# Patient Record
Sex: Female | Born: 1955 | Race: White | Hispanic: No | Marital: Married | Smoking: Never smoker
Health system: Southern US, Community
[De-identification: ages and names within clinical notes are randomized; demographics above are authoritative.]

## PROBLEM LIST (undated history)

## (undated) DIAGNOSIS — F419 Anxiety disorder, unspecified: Secondary | ICD-10-CM

## (undated) DIAGNOSIS — C801 Malignant (primary) neoplasm, unspecified: Secondary | ICD-10-CM

## (undated) DIAGNOSIS — T8859XA Other complications of anesthesia, initial encounter: Secondary | ICD-10-CM

## (undated) DIAGNOSIS — T502X5A Adverse effect of carbonic-anhydrase inhibitors, benzothiadiazides and other diuretics, initial encounter: Secondary | ICD-10-CM

## (undated) DIAGNOSIS — T4145XA Adverse effect of unspecified anesthetic, initial encounter: Secondary | ICD-10-CM

## (undated) DIAGNOSIS — R7303 Prediabetes: Secondary | ICD-10-CM

## (undated) DIAGNOSIS — I10 Essential (primary) hypertension: Secondary | ICD-10-CM

## (undated) DIAGNOSIS — I499 Cardiac arrhythmia, unspecified: Secondary | ICD-10-CM

## (undated) DIAGNOSIS — E876 Hypokalemia: Secondary | ICD-10-CM

## (undated) HISTORY — PX: COARCTATION OF AORTA REPAIR: SHX261

## (undated) HISTORY — PX: TONSILLECTOMY: SUR1361

## (undated) HISTORY — PX: ADENOIDECTOMY W/ MYRINGOTOMY: SHX1128

## (undated) HISTORY — PX: ENDOMETRIAL ABLATION: SHX621

---
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
## 2000-08-14 ENCOUNTER — Encounter (INDEPENDENT_AMBULATORY_CARE_PROVIDER_SITE_OTHER): Payer: Self-pay | Admitting: Specialist

## 2000-08-14 ENCOUNTER — Ambulatory Visit (HOSPITAL_COMMUNITY): Admission: RE | Admit: 2000-08-14 | Discharge: 2000-08-14 | Payer: Self-pay | Admitting: Urology

## 2004-07-24 ENCOUNTER — Other Ambulatory Visit: Admission: RE | Admit: 2004-07-24 | Discharge: 2004-07-24 | Payer: Self-pay | Admitting: Interventional Radiology

## 2004-07-24 ENCOUNTER — Encounter: Admission: RE | Admit: 2004-07-24 | Discharge: 2004-07-24 | Payer: Self-pay | Admitting: Surgery

## 2004-07-24 ENCOUNTER — Encounter (INDEPENDENT_AMBULATORY_CARE_PROVIDER_SITE_OTHER): Payer: Self-pay | Admitting: Specialist

## 2008-05-09 ENCOUNTER — Ambulatory Visit (HOSPITAL_COMMUNITY): Admission: RE | Admit: 2008-05-09 | Discharge: 2008-05-09 | Payer: Self-pay | Admitting: Obstetrics & Gynecology

## 2008-05-09 ENCOUNTER — Encounter (INDEPENDENT_AMBULATORY_CARE_PROVIDER_SITE_OTHER): Payer: Self-pay | Admitting: Obstetrics & Gynecology

## 2010-06-09 ENCOUNTER — Encounter: Payer: Self-pay | Admitting: Obstetrics & Gynecology

## 2010-10-02 NOTE — Op Note (Signed)
Laurie Mcneil, Laurie Mcneil             ACCOUNT NO.:  [id number]   MEDICAL RECORD NO.:  [id number]          PATIENT TYPE:  AMB   LOCATION:  SDC                           FACILITY:  [facility]   PHYSICIAN:  Genia Del, M.D.DATE OF BIRTH:  1956-03-25   DATE OF PROCEDURE:  DATE OF DISCHARGE:                               OPERATIVE REPORT   PREOPERATIVE DIAGNOSES:  Menorrhagia, normal endometrial cavity by  sonohysterogram, and benign endometrial biopsy.   POSTOPERATIVE DIAGNOSES:  Menorrhagia, normal endometrial cavity by  sonohysterogram, and benign endometrial biopsy, plus mild fundal  intrauterine synechiae.   PROCEDURES:  Hysteroscopy, dilatation and curettage with NovaSure  endometrial ablation.   SURGEON:  Genia Del, MD   ANESTHESIOLOGIST:  Belva Agee, MD   PROCEDURE:  Under general anesthesia with laryngeal mask, the patient  was in lithotomy position.  She was prepped with Betadine on the  suprapubic vulvar and vaginal areas and draped as usual.  The vaginal  exam reveals an anteverted uterus, normal volume, no adnexal mass.  The  speculum was introduced into the vagina.  We put a tenaculum on the  anterior lip of the cervix.  A paracervical block was done with  Nesacaine 1% 10 mL total at 4 and 8 o'clock.  We then proceed with a  hysterometry, which was at 11 cm.  The cervical length was at 5 cm, for  an endometrial cavity at 6 cm.  We then completed the cervical  dilatation with Hegar dilators up to #27 without difficulty.  We then  insert the diagnostic hysteroscope.  The intrauterine cavity was  inspected.  Both ostia are seen and pictures are taken.  We note mild  synechiae at the fundus of an intrauterine cavity.  No other  intrauterine lesion.  The hysteroscope was therefore removed.  We  proceed with a systematic curettage of the intrauterine cavity.  The  endometrial curettings are sent to pathology.  This was done with the  sharp curette on all surfaces.   We then insert the NovaSure device.  We  measured the width of the intrauterine cavity which was at 4.7 cm.  We  do the integrity test which was passed and then proceed with the  endometrial ablation.  The power was 155, duration 1 minute 30 seconds.  The instrument was then removed.  The tenaculum was removed from the  anterior lip of the cervix.  Hemostasis was adequate.  We removed the  speculum as well.  The estimated blood loss was minimal.  No  complications occurred and the patient was brought to recovery room in  good stable status.      Genia Del, M.D.  Electronically Signed     ML/MEDQ  D:  05/09/2008  T:  05/09/2008  Job:  [id number]

## 2010-10-05 NOTE — Op Note (Signed)
[facility]  Patient:    Laurie Mcneil, Laurie Mcneil                    MRN: [id number] Proc. Date: 08/14/00 Adm. Date:  [id number] Attending:  Laqueta Jean                           Operative Report  PREOPERATIVE DIAGNOSIS:  Interstitial cystitis.  POSTOPERATIVE DIAGNOSIS:  Interstitial cystitis.  OPERATION: 1. Cystourethroscopy 2. Urethral dilation. 3. Hydrodistention of the bladder. 4. Cold cup bladder biopsy and cauterization of biopsy sites. 5. Insertion of Marcaine and Pyridium in the bladder, injection of    Marcaine and Kenalog into the periurethral space, B & O suppository    placement.  SURGEON:  Sigmund I. Patsi Sears, M.D.  ANESTHESIA:  General (LMA).  PROCEDURE PREPARATION:  After appropriate preanesthesia, the patient was brought to the operating room and placed on the operating table in the dorsal supine position where general LMA anesthesia was introduced.  BRIEF HISTORY:  This 55 year old married white female has a history of urinary frequency, urgency, suprapubic pressure, with past history of panic attacks and possible ADHD.  There is specifically no history of migraines or irritable bowel syndrome or asthma.  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  DESCRIPTION OF PROCEDURE:  The urethra is dilated to a size 26-French with the urethral sounds.  Following this, cystoscopy was accomplished which shows a normal-appearing bladder.  The bladder holds 750 cc and is hydrodistended to 900 cc.  Biopsy is taken of the bladder, and following this, the sites are cauterized.  Marcaine/Pyridium solution is inserted into the bladder and Marcaine/Kenalog is injected into the periurethral space in the subtrigonal space.  B & O suppository is placed.  A vaginal packing is placed and will be removed in the recovery room.  She was awakened and take to recovery in good condition. DD:  08/14/00 TD:  08/14/00 Job:  [id number]

## 2011-02-22 LAB — CBC
RBC: 4.57 MIL/uL (ref 3.87–5.11)
WBC: 6.6 10*3/uL (ref 4.0–10.5)

## 2011-02-22 LAB — BASIC METABOLIC PANEL
BUN: 7 mg/dL (ref 6–23)
CO2: 32 mEq/L (ref 19–32)
Calcium: 9.5 mg/dL (ref 8.4–10.5)
Chloride: 99 mEq/L (ref 96–112)
Creatinine, Ser: 0.7 mg/dL (ref 0.4–1.2)
GFR calc Af Amer: >60 mL/min (ref >60)
GFR calc non Af Amer: >60 mL/min (ref >60)
Glucose, Bld: 73 mg/dL (ref 70–99)
Potassium: 3.1 mEq/L — ABNORMAL LOW (ref 3.5–5.1)
Sodium: 138 mEq/L (ref 135–145)

## 2011-02-22 LAB — PREGNANCY, URINE: Preg Test, Ur: NEGATIVE

## 2011-07-12 ENCOUNTER — Other Ambulatory Visit: Payer: Self-pay | Admitting: Dermatology

## 2012-07-15 ENCOUNTER — Other Ambulatory Visit: Payer: Self-pay | Admitting: Dermatology

## 2013-07-22 ENCOUNTER — Other Ambulatory Visit: Payer: Self-pay | Admitting: Dermatology

## 2013-07-27 ENCOUNTER — Other Ambulatory Visit: Payer: Self-pay | Admitting: Obstetrics & Gynecology

## 2013-07-29 ENCOUNTER — Encounter (HOSPITAL_COMMUNITY): Payer: Self-pay

## 2013-07-29 ENCOUNTER — Encounter (HOSPITAL_COMMUNITY)
Admission: RE | Admit: 2013-07-29 | Discharge: 2013-07-29 | Disposition: A | Discharge status: Home / Self Care | Payer: BC Managed Care – PPO | Source: Ambulatory Visit | Attending: Obstetrics & Gynecology | Admitting: Obstetrics & Gynecology

## 2013-07-29 DIAGNOSIS — Z01812 Encounter for preprocedural laboratory examination: Secondary | ICD-10-CM | POA: Insufficient documentation

## 2013-07-29 HISTORY — DX: Anxiety disorder, unspecified: F41.9

## 2013-07-29 HISTORY — DX: Malignant (primary) neoplasm, unspecified: C80.1

## 2013-07-29 HISTORY — DX: Cardiac arrhythmia, unspecified: I49.9

## 2013-07-29 LAB — BASIC METABOLIC PANEL
BUN: 15 mg/dL (ref 6–23)
CALCIUM: 9.9 mg/dL (ref 8.4–10.5)
CO2: 30 meq/L (ref 19–32)
CREATININE: 0.69 mg/dL (ref 0.50–1.10)
Chloride: 102 mEq/L (ref 96–112)
Glucose, Bld: 107 mg/dL — ABNORMAL HIGH (ref 70–99)
Potassium: 3.2 mEq/L — ABNORMAL LOW (ref 3.7–5.3)
SODIUM: 144 meq/L (ref 137–147)

## 2013-07-29 LAB — CBC
HCT: 38 % (ref 36.0–46.0)
Hemoglobin: 14.1 g/dL (ref 12.0–15.0)
MCH: 30.9 pg (ref 26.0–34.0)
MCHC: 37.1 g/dL — AB (ref 30.0–36.0)
MCV: 83.3 fL (ref 78.0–100.0)
PLATELETS: 164 10*3/uL (ref 150–400)
RBC: 4.56 MIL/uL (ref 3.87–5.11)
RDW: 13.9 % (ref 11.5–15.5)
WBC: 6 10*3/uL (ref 4.0–10.5)

## 2013-07-29 NOTE — Patient Instructions (Addendum)
Your procedure is scheduled on: Thursday, August 05, 2013  Enter through the [employer] [facility] at: 6:00AM  Pick up the phone at the desk and dial [phone].  Call this number if you have problems the morning of surgery: [phone].  Remember: Do NOT eat food: AFTER MIDNIGHT WEDNESDAY Do NOT drink clear liquids after: AFTER MIDNIGHT WEDNESDAY  Take these medicines the morning of surgery with a SIP OF WATER: Nadolol  Do NOT wear jewelry (body piercing), make-up, or nail polish. Do NOT wear lotions, powders, or perfumes.  You may wear deoderant. Do NOT shave for 48 hours prior to surgery. Do NOT bring valuables to the hospital. Contacts, dentures, or bridgework may not be worn into surgery. Leave suitcase in car.  After surgery it may be brought to your room.  For patients admitted to the hospital, checkout time is 11:00 AM the day of discharge.

## 2013-08-04 MED ORDER — CEFAZOLIN SODIUM-DEXTROSE 2-3 GM-% IV SOLR
2.0000 g | INTRAVENOUS | Status: AC
  Administered 2013-08-05 (×2): 2 g via INTRAVENOUS

## 2013-08-05 ENCOUNTER — Encounter (HOSPITAL_COMMUNITY): Payer: BC Managed Care – PPO | Admitting: Anesthesiology

## 2013-08-05 ENCOUNTER — Encounter (HOSPITAL_COMMUNITY): Payer: Self-pay | Admitting: Certified Registered"

## 2013-08-05 ENCOUNTER — Ambulatory Visit (HOSPITAL_COMMUNITY)
Admission: RE | Admit: 2013-08-05 | Discharge: 2013-08-06 | Disposition: A | Discharge status: Home / Self Care | Payer: BC Managed Care – PPO | Source: Ambulatory Visit | Attending: Obstetrics & Gynecology | Admitting: Obstetrics & Gynecology

## 2013-08-05 ENCOUNTER — Encounter (HOSPITAL_COMMUNITY): Payer: Self-pay | Admitting: Pharmacy Technician

## 2013-08-05 ENCOUNTER — Encounter (HOSPITAL_COMMUNITY)
Admission: RE | Disposition: A | Discharge status: Home / Self Care | Payer: Self-pay | Source: Ambulatory Visit | Attending: Obstetrics & Gynecology

## 2013-08-05 ENCOUNTER — Ambulatory Visit (HOSPITAL_COMMUNITY): Payer: BC Managed Care – PPO | Admitting: Anesthesiology

## 2013-08-05 DIAGNOSIS — D251 Intramural leiomyoma of uterus: Secondary | ICD-10-CM | POA: Insufficient documentation

## 2013-08-05 DIAGNOSIS — J988 Other specified respiratory disorders: Secondary | ICD-10-CM | POA: Insufficient documentation

## 2013-08-05 DIAGNOSIS — N8000 Endometriosis of the uterus, unspecified: Secondary | ICD-10-CM | POA: Insufficient documentation

## 2013-08-05 DIAGNOSIS — I499 Cardiac arrhythmia, unspecified: Secondary | ICD-10-CM | POA: Insufficient documentation

## 2013-08-05 DIAGNOSIS — Z9889 Other specified postprocedural states: Secondary | ICD-10-CM

## 2013-08-05 DIAGNOSIS — N840 Polyp of corpus uteri: Secondary | ICD-10-CM | POA: Insufficient documentation

## 2013-08-05 DIAGNOSIS — N920 Excessive and frequent menstruation with regular cycle: Secondary | ICD-10-CM | POA: Insufficient documentation

## 2013-08-05 DIAGNOSIS — D252 Subserosal leiomyoma of uterus: Secondary | ICD-10-CM | POA: Insufficient documentation

## 2013-08-05 DIAGNOSIS — I10 Essential (primary) hypertension: Secondary | ICD-10-CM | POA: Insufficient documentation

## 2013-08-05 DIAGNOSIS — F411 Generalized anxiety disorder: Secondary | ICD-10-CM | POA: Insufficient documentation

## 2013-08-05 HISTORY — PX: ROBOTIC ASSISTED TOTAL HYSTERECTOMY: SHX6085

## 2013-08-05 HISTORY — PX: BILATERAL SALPINGECTOMY: SHX5743

## 2013-08-05 LAB — CBC
HCT: 41.3 % (ref 36.0–46.0)
HEMOGLOBIN: 15.2 g/dL — AB (ref 12.0–15.0)
MCH: 31.2 pg (ref 26.0–34.0)
MCHC: 36.8 g/dL — ABNORMAL HIGH (ref 30.0–36.0)
MCV: 84.8 fL (ref 78.0–100.0)
Platelets: 152 10*3/uL (ref 150–400)
RBC: 4.87 MIL/uL (ref 3.87–5.11)
RDW: 13.5 % (ref 11.5–15.5)
WBC: 14.1 10*3/uL — ABNORMAL HIGH (ref 4.0–10.5)

## 2013-08-05 LAB — TYPE AND SCREEN
ABO/RH(D): O NEG
ANTIBODY SCREEN: NEGATIVE

## 2013-08-05 LAB — TROPONIN I: Troponin I: <0.3 ng/mL (ref <0.30)

## 2013-08-05 LAB — MRSA PCR SCREENING: MRSA by PCR: NEGATIVE

## 2013-08-05 LAB — ABO/RH: ABO/RH(D): O NEG

## 2013-08-05 LAB — PREGNANCY, URINE: Preg Test, Ur: NEGATIVE

## 2013-08-05 SURGERY — ROBOTIC ASSISTED TOTAL HYSTERECTOMY
Anesthesia: General | Site: Abdomen

## 2013-08-05 MED ORDER — DEXAMETHASONE SODIUM PHOSPHATE 10 MG/ML IJ SOLN
INTRAMUSCULAR | Status: DC | PRN
  Administered 2013-08-05: 10 mg via INTRAVENOUS

## 2013-08-05 MED ORDER — HYDROMORPHONE HCL PF 1 MG/ML IJ SOLN
INTRAMUSCULAR | Status: DC | PRN
  Administered 2013-08-05: 1 mg via INTRAVENOUS

## 2013-08-05 MED ORDER — PROMETHAZINE HCL 25 MG/ML IJ SOLN: 6.2500 mg | INTRAMUSCULAR | Status: DC | PRN

## 2013-08-05 MED ORDER — MEPERIDINE HCL 25 MG/ML IJ SOLN: 6.2500 mg | INTRAMUSCULAR | Status: DC | PRN

## 2013-08-05 MED ORDER — BUPIVACAINE HCL (PF) 0.25 % IJ SOLN
INTRAMUSCULAR | Status: AC
  Filled 2013-08-05: qty 20

## 2013-08-05 MED ORDER — EPHEDRINE 5 MG/ML INJ
INTRAVENOUS | Status: AC
  Filled 2013-08-05: qty 20

## 2013-08-05 MED ORDER — LACTATED RINGERS IR SOLN
Status: DC | PRN
  Administered 2013-08-05: 3000 mL

## 2013-08-05 MED ORDER — MIDAZOLAM HCL 2 MG/2ML IJ SOLN: 0.5000 mg | Freq: Once | INTRAMUSCULAR | Status: DC | PRN

## 2013-08-05 MED ORDER — GLYCOPYRROLATE 0.2 MG/ML IJ SOLN
INTRAMUSCULAR | Status: AC
  Filled 2013-08-05: qty 2

## 2013-08-05 MED ORDER — CEFAZOLIN SODIUM-DEXTROSE 2-3 GM-% IV SOLR
INTRAVENOUS | Status: AC
  Filled 2013-08-05: qty 50

## 2013-08-05 MED ORDER — ROCURONIUM BROMIDE 100 MG/10ML IV SOLN
INTRAVENOUS | Status: AC
  Filled 2013-08-05: qty 1

## 2013-08-05 MED ORDER — KETOROLAC TROMETHAMINE 30 MG/ML IJ SOLN
INTRAMUSCULAR | Status: AC
  Filled 2013-08-05: qty 1

## 2013-08-05 MED ORDER — EPHEDRINE SULFATE 50 MG/ML IJ SOLN
INTRAMUSCULAR | Status: DC | PRN
  Administered 2013-08-05 (×5): 10 mg via INTRAVENOUS
  Administered 2013-08-05: 5 mg via INTRAVENOUS

## 2013-08-05 MED ORDER — LIDOCAINE HCL (CARDIAC) 20 MG/ML IV SOLN
INTRAVENOUS | Status: AC
  Filled 2013-08-05: qty 5

## 2013-08-05 MED ORDER — NEOSTIGMINE METHYLSULFATE 1 MG/ML IJ SOLN
INTRAMUSCULAR | Status: DC | PRN
  Administered 2013-08-05: 4 mg via INTRAVENOUS

## 2013-08-05 MED ORDER — KETOROLAC TROMETHAMINE 30 MG/ML IJ SOLN
INTRAMUSCULAR | Status: DC | PRN
  Administered 2013-08-05: 30 mg via INTRAVENOUS

## 2013-08-05 MED ORDER — ONDANSETRON HCL 4 MG/2ML IJ SOLN
INTRAMUSCULAR | Status: AC
  Filled 2013-08-05: qty 2

## 2013-08-05 MED ORDER — LIDOCAINE HCL (CARDIAC) 20 MG/ML IV SOLN
INTRAVENOUS | Status: DC | PRN
  Administered 2013-08-05: 80 mg via INTRAVENOUS

## 2013-08-05 MED ORDER — LACTATED RINGERS IV SOLN
INTRAVENOUS | Status: DC
  Administered 2013-08-05: 08:00:00 via INTRAVENOUS
  Administered 2013-08-05: 100 mL via INTRAVENOUS
  Administered 2013-08-05: 12:00:00 via INTRAVENOUS

## 2013-08-05 MED ORDER — FENTANYL CITRATE 0.05 MG/ML IJ SOLN
INTRAMUSCULAR | Status: DC | PRN
  Administered 2013-08-05 (×3): 50 ug via INTRAVENOUS
  Administered 2013-08-05: 100 ug via INTRAVENOUS

## 2013-08-05 MED ORDER — PHENYLEPHRINE 40 MCG/ML (10ML) SYRINGE FOR IV PUSH (FOR BLOOD PRESSURE SUPPORT)
PREFILLED_SYRINGE | INTRAVENOUS | Status: AC
  Filled 2013-08-05: qty 5

## 2013-08-05 MED ORDER — LACTATED RINGERS IV SOLN
INTRAVENOUS | Status: DC
  Administered 2013-08-05 – 2013-08-06 (×3): via INTRAVENOUS

## 2013-08-05 MED ORDER — HYDROMORPHONE HCL PF 1 MG/ML IJ SOLN: 0.2500 mg | INTRAMUSCULAR | Status: DC | PRN

## 2013-08-05 MED ORDER — BUPIVACAINE HCL (PF) 0.25 % IJ SOLN
INTRAMUSCULAR | Status: DC | PRN
  Administered 2013-08-05: 14 mL

## 2013-08-05 MED ORDER — OXYCODONE-ACETAMINOPHEN 5-325 MG PO TABS
1.0000 | ORAL_TABLET | ORAL | Status: DC | PRN
  Administered 2013-08-06: 1 via ORAL
  Filled 2013-08-05: qty 1

## 2013-08-05 MED ORDER — PAROXETINE HCL 10 MG PO TABS
5.0000 mg | ORAL_TABLET | Freq: Every day | ORAL | Status: DC
  Administered 2013-08-06: 5 mg via ORAL
  Filled 2013-08-05 (×4): qty 0.5

## 2013-08-05 MED ORDER — PROPOFOL 10 MG/ML IV BOLUS
INTRAVENOUS | Status: DC | PRN
  Administered 2013-08-05: 200 mg via INTRAVENOUS

## 2013-08-05 MED ORDER — ONDANSETRON HCL 4 MG/2ML IJ SOLN
INTRAMUSCULAR | Status: DC | PRN
  Administered 2013-08-05: 4 mg via INTRAVENOUS

## 2013-08-05 MED ORDER — IBUPROFEN 600 MG PO TABS
600.0000 mg | ORAL_TABLET | Freq: Four times a day (QID) | ORAL | Status: DC | PRN
  Administered 2013-08-05 – 2013-08-06 (×2): 600 mg via ORAL
  Filled 2013-08-05 (×2): qty 1

## 2013-08-05 MED ORDER — HYDROMORPHONE HCL PF 1 MG/ML IJ SOLN
INTRAMUSCULAR | Status: AC
  Filled 2013-08-05: qty 1

## 2013-08-05 MED ORDER — MIDAZOLAM HCL 2 MG/2ML IJ SOLN
INTRAMUSCULAR | Status: AC
  Filled 2013-08-05: qty 2

## 2013-08-05 MED ORDER — FENTANYL CITRATE 0.05 MG/ML IJ SOLN
INTRAMUSCULAR | Status: AC
  Filled 2013-08-05: qty 2

## 2013-08-05 MED ORDER — FENTANYL CITRATE 0.05 MG/ML IJ SOLN
INTRAMUSCULAR | Status: AC
  Filled 2013-08-05: qty 5

## 2013-08-05 MED ORDER — MIDAZOLAM HCL 2 MG/2ML IJ SOLN
INTRAMUSCULAR | Status: DC | PRN
  Administered 2013-08-05: 2 mg via INTRAVENOUS

## 2013-08-05 MED ORDER — KETOROLAC TROMETHAMINE 30 MG/ML IJ SOLN: 15.0000 mg | Freq: Once | INTRAMUSCULAR | Status: DC | PRN

## 2013-08-05 MED ORDER — NEOSTIGMINE METHYLSULFATE 1 MG/ML IJ SOLN
INTRAMUSCULAR | Status: AC
  Filled 2013-08-05: qty 1

## 2013-08-05 MED ORDER — GLYCOPYRROLATE 0.2 MG/ML IJ SOLN
INTRAMUSCULAR | Status: AC
  Filled 2013-08-05: qty 3

## 2013-08-05 MED ORDER — PROPOFOL 10 MG/ML IV EMUL
INTRAVENOUS | Status: AC
  Filled 2013-08-05: qty 20

## 2013-08-05 MED ORDER — ROCURONIUM BROMIDE 100 MG/10ML IV SOLN
INTRAVENOUS | Status: DC | PRN
  Administered 2013-08-05: 20 mg via INTRAVENOUS
  Administered 2013-08-05: 10 mg via INTRAVENOUS
  Administered 2013-08-05: 50 mg via INTRAVENOUS
  Administered 2013-08-05: 10 mg via INTRAVENOUS

## 2013-08-05 MED ORDER — DEXAMETHASONE SODIUM PHOSPHATE 10 MG/ML IJ SOLN
INTRAMUSCULAR | Status: AC
  Filled 2013-08-05: qty 1

## 2013-08-05 MED ORDER — PHENYLEPHRINE HCL 10 MG/ML IJ SOLN
INTRAMUSCULAR | Status: DC | PRN
Start: 2013-08-05 — End: 2013-08-05
  Administered 2013-08-05 (×2): 40 ug via INTRAVENOUS

## 2013-08-05 MED ORDER — HYDROMORPHONE HCL PF 1 MG/ML IJ SOLN: 1.0000 mg | INTRAMUSCULAR | Status: DC | PRN

## 2013-08-05 MED ORDER — GLYCOPYRROLATE 0.2 MG/ML IJ SOLN
INTRAMUSCULAR | Status: DC | PRN
  Administered 2013-08-05: 0.3 mg via INTRAVENOUS
  Administered 2013-08-05: .7 mg via INTRAVENOUS
  Administered 2013-08-05: 0.3 mg via INTRAVENOUS

## 2013-08-05 MED ORDER — NADOLOL-BENDROFLUMETHIAZIDE 40-5 MG PO TABS
0.5000 | ORAL_TABLET | Freq: Every day | ORAL | Status: DC
  Administered 2013-08-06: 0.5 via ORAL

## 2013-08-05 SURGICAL SUPPLY — 65 items
ADH SKN CLS APL DERMABOND .7 (GAUZE/BANDAGES/DRESSINGS) ×1
BAG URINE DRAINAGE (UROLOGICAL SUPPLIES) ×3 IMPLANT
BARRIER ADHS 3X4 INTERCEED (GAUZE/BANDAGES/DRESSINGS) ×3 IMPLANT
BRR ADH 4X3 ABS CNTRL BYND (GAUZE/BANDAGES/DRESSINGS) ×1
CATH FOLEY 3WAY  5CC 16FR (CATHETERS) ×2
CATH FOLEY 3WAY 5CC 16FR (CATHETERS) ×1 IMPLANT
CLOTH BEACON ORANGE TIMEOUT ST (SAFETY) ×3 IMPLANT
CONT PATH 16OZ SNAP LID 3702 (MISCELLANEOUS) ×3 IMPLANT
COVER MAYO STAND STRL (DRAPES) ×3 IMPLANT
COVER TABLE BACK 60X90 (DRAPES) ×6 IMPLANT
COVER TIP SHEARS 8 DVNC (MISCELLANEOUS) ×1 IMPLANT
COVER TIP SHEARS 8MM DA VINCI (MISCELLANEOUS) ×2
DECANTER SPIKE VIAL GLASS SM (MISCELLANEOUS) ×3 IMPLANT
DERMABOND ADVANCED (GAUZE/BANDAGES/DRESSINGS) ×2
DERMABOND ADVANCED .7 DNX12 (GAUZE/BANDAGES/DRESSINGS) ×2 IMPLANT
DRAPE HUG U DISPOSABLE (DRAPE) ×3 IMPLANT
DRAPE LG THREE QUARTER DISP (DRAPES) ×6 IMPLANT
DRAPE WARM FLUID 44X44 (DRAPE) ×3 IMPLANT
ELECT REM PT RETURN 9FT ADLT (ELECTROSURGICAL) ×3
ELECTRODE REM PT RTRN 9FT ADLT (ELECTROSURGICAL) ×1 IMPLANT
EVACUATOR SMOKE 8.L (FILTER) ×3 IMPLANT
GAUZE VASELINE 3X9 (GAUZE/BANDAGES/DRESSINGS) IMPLANT
GLOVE BIO SURGEON STRL SZ 6.5 (GLOVE) ×3 IMPLANT
GLOVE BIO SURGEON STRL SZ7 (GLOVE) ×5 IMPLANT
GLOVE BIO SURGEONS STRL SZ 6.5 (GLOVE) ×2
GLOVE BIOGEL PI IND STRL 7.0 (GLOVE) ×1 IMPLANT
GLOVE BIOGEL PI INDICATOR 7.0 (GLOVE) ×6
GOWN STRL REUS W/TWL LRG LVL3 (GOWN DISPOSABLE) ×18 IMPLANT
IV STOPCOCK 4 WAY 40  W/Y SET (IV SOLUTION)
IV STOPCOCK 4 WAY 40 W/Y SET (IV SOLUTION) IMPLANT
KIT ACCESSORY DA VINCI DISP (KITS) ×2
KIT ACCESSORY DVNC DISP (KITS) ×1 IMPLANT
LEGGING LITHOTOMY PAIR STRL (DRAPES) ×3 IMPLANT
NEEDLE HYPO 22GX1.5 SAFETY (NEEDLE) IMPLANT
OCCLUDER COLPOPNEUMO (BALLOONS) ×2 IMPLANT
PACK LAVH (CUSTOM PROCEDURE TRAY) ×3 IMPLANT
PAD PREP 24X48 CUFFED NSTRL (MISCELLANEOUS) ×6 IMPLANT
PLUG CATH AND CAP STER (CATHETERS) ×3 IMPLANT
PROTECTOR NERVE ULNAR (MISCELLANEOUS) ×6 IMPLANT
SET CYSTO W/LG BORE CLAMP LF (SET/KITS/TRAYS/PACK) IMPLANT
SET IRRIG TUBING LAPAROSCOPIC (IRRIGATION / IRRIGATOR) ×6 IMPLANT
SOLUTION ELECTROLUBE (MISCELLANEOUS) ×3 IMPLANT
SUT VIC AB 0 CT1 27 (SUTURE) ×9
SUT VIC AB 0 CT1 27XBRD ANBCTR (SUTURE) ×2 IMPLANT
SUT VIC AB 0 CT1 27XBRD ANTBC (SUTURE) IMPLANT
SUT VIC AB 4-0 PS2 27 (SUTURE) ×6 IMPLANT
SUT VICRYL 0 UR6 27IN ABS (SUTURE) ×6 IMPLANT
SUT VLOC 180 0 9IN  GS21 (SUTURE) ×4
SUT VLOC 180 0 9IN GS21 (SUTURE) IMPLANT
SYR 50ML LL SCALE MARK (SYRINGE) ×3 IMPLANT
SYSTEM CONVERTIBLE TROCAR (TROCAR) ×2 IMPLANT
TIP RUMI ORANGE 6.7MMX12CM (TIP) IMPLANT
TIP UTERINE 5.1X6CM LAV DISP (MISCELLANEOUS) IMPLANT
TIP UTERINE 6.7X10CM GRN DISP (MISCELLANEOUS) ×2 IMPLANT
TIP UTERINE 6.7X6CM WHT DISP (MISCELLANEOUS) IMPLANT
TIP UTERINE 6.7X8CM BLUE DISP (MISCELLANEOUS) IMPLANT
TOWEL OR 17X24 6PK STRL BLUE (TOWEL DISPOSABLE) ×9 IMPLANT
TROCAR 12M 150ML BLUNT (TROCAR) IMPLANT
TROCAR DISP BLADELESS 8 DVNC (TROCAR) ×1 IMPLANT
TROCAR DISP BLADELESS 8MM (TROCAR) ×2
TROCAR XCEL 12X100 BLDLESS (ENDOMECHANICALS) ×3 IMPLANT
TROCAR XCEL NON-BLD 5MMX100MML (ENDOMECHANICALS) ×3 IMPLANT
TUBING FILTER THERMOFLATOR (ELECTROSURGICAL) ×6 IMPLANT
WARMER LAPAROSCOPE (MISCELLANEOUS) ×3 IMPLANT
WATER STERILE IRR 1000ML POUR (IV SOLUTION) ×9 IMPLANT

## 2013-08-05 NOTE — Transfer of Care (Signed)
Immediate Anesthesia Transfer of Care Note  Patient: Laurie Mcneil  Procedure(s) Performed: Procedure(s): ROBOTIC ASSISTED TOTAL HYSTERECTOMY (N/A) BILATERAL SALPINGECTOMY (N/A)  Patient Location: PACU  Anesthesia Type:General  Level of Consciousness: awake, alert  and oriented  Airway & Oxygen Therapy: Patient Spontanous Breathing and Patient connected to face mask oxygen  Post-op Assessment: Report given to PACU RN, Post -op Vital signs reviewed and stable and Patient moving all extremities X 4  Post vital signs: Reviewed and stable  Complications: No apparent anesthesia complications

## 2013-08-05 NOTE — Anesthesia Postprocedure Evaluation (Signed)
Anesthesia Post Note  Patient: Laurie Mcneil  Procedure(s) Performed: Procedure(s) (LRB): ROBOTIC ASSISTED TOTAL HYSTERECTOMY (N/A) BILATERAL SALPINGECTOMY (N/A)  Anesthesia type: General  Patient location: PACU  Post pain: Pain level controlled  Post assessment: Post-op Vital signs reviewed  Last Vitals:  Filed Vitals:   08/05/13 1330  BP: 136/78  Pulse: 65  Temp:   Resp: 18    Post vital signs: Reviewed  Level of consciousness: sedated  Complications: Had a brief 1-3 min period post extubation where she became apneic. Because the ECG leads had been taken off and the pulse oximeter was not recording CPR was in progress and a LMA had been placed prior to my arrival. When ECG leads were placed by me the patient had a NSR that was confirmed by palpation of R radial pulse. The patient began efforts to breathe spontaneously and there was prompt resumption of an appropriate end tidal CO2 tracing as well as well as 100% oxygen saturation. The patient has a remote history of cardiac valvular surgery. She has no ECG to compare her current one to despite the fact she had surgery in this hospital at age 58 in 2009. She has been stable in the recovery room, but will go to the AICU out of an abundance of caution. She is currently being ruled out for MI despite the respiratory nature of her arrest. The ECG is inconclusive.

## 2013-08-05 NOTE — H&P (Signed)
Laurie Mcneil is an 58 y.o. female G1P1  RP:  Menorrhagia with uterine myomas for Robotic TLH, salpingectomies  Pertinent Gynecological History: Menses: flow is moderate Contraception: condoms Blood transfusions: none Sexually transmitted diseases: no past history Last mammogram: normal  Last pap: normal OB History:  G1P1   Menstrual History:  No LMP recorded.    Past Medical History  Diagnosis Date  . Dysrhythmia   . Anxiety   . Cancer     SKIN    Past Surgical History  Procedure Laterality Date  . Coarctation of aorta repair  04/1970    AGE 13   . Cesarean section  1994  . Endometrial ablation    . Tonsillectomy    . Adenoidectomy w/ myringotomy      No family history on file.  Social History:  reports that she has never smoked. She has never used smokeless tobacco. She reports that she drinks alcohol. She reports that she does not use illicit drugs.  Allergies: No Known Allergies  Prescriptions prior to admission  Medication Sig Dispense Refill  . nadolol-bendroflumethiazide (CORZIDE) 40-5 MG per tablet Take 0.5 tablets by mouth daily.       Marland Kitchen PARoxetine (PAXIL) 10 MG tablet Take 5 mg by mouth daily.         ROS  Blood pressure 134/86, pulse 59, temperature 97.7 F (36.5 C), temperature source Oral, resp. rate 16, SpO2 99.00%. Physical Exam  Pelvic US:  Uterus with myomas, volume 565 g  04/2013  EBx benign endometrium with polyps  05/2013  Results for orders placed during the hospital encounter of 08/05/13 (from the past 24 hour(s))  TYPE AND SCREEN     Status: None   Collection Time    08/05/13  6:15 AM      Result Value Ref Range   ABO/RH(D) O NEG     Antibody Screen NEG     Sample Expiration 08/08/2013    PREGNANCY, URINE     Status: None   Collection Time    08/05/13  6:20 AM      Result Value Ref Range   Preg Test, Ur NEGATIVE  NEGATIVE    No results found.  Assessment/Plan: Menorrhagia with uterine myomas for Robotic  TLH/salpingectomies.  Surgery and risks reviewed.  Jaden Abreu,MARIE-LYNE 08/05/2013, 7:17 AM

## 2013-08-05 NOTE — Anesthesia Postprocedure Evaluation (Signed)
  Anesthesia Post-op Note  Anesthesia Post Note  Patient: Laurie Mcneil  Procedure(s) Performed: Procedure(s) (LRB): ROBOTIC ASSISTED TOTAL HYSTERECTOMY (N/A) BILATERAL SALPINGECTOMY (N/A)  Anesthesia type: General  Patient location: [facility]  Post pain: Pain level controlled  Post assessment: Post-op Vital signs reviewed  Last Vitals:  Filed Vitals:   08/05/13 1603  BP: 134/74  Pulse: 69  Temp:   Resp: 16    Post vital signs: Reviewed  Level of consciousness: sedated  Complications: No apparent anesthesia complications

## 2013-08-05 NOTE — Anesthesia Preprocedure Evaluation (Addendum)
Anesthesia Evaluation  Patient identified by MRN, date of birth, ID band Patient awake    Reviewed: Allergy & Precautions, H&P , NPO status , Patient's Chart, lab work & pertinent test results  Airway Mallampati: II TM Distance: >3 FB Neck ROM: Full    Dental  (+) Dental Advisory Given   Pulmonary neg pulmonary ROS,  breath sounds clear to auscultation        Cardiovascular hypertension, Pt. on home beta blockers and Pt. on medications + dysrhythmias Rhythm:Regular Rate:Normal     Neuro/Psych PSYCHIATRIC DISORDERS Anxiety negative neurological ROS     GI/Hepatic negative GI ROS, Neg liver ROS,   Endo/Other  negative endocrine ROS  Renal/GU negative Renal ROS     Musculoskeletal negative musculoskeletal ROS (+)   Abdominal (+) + obese,   Peds  Hematology negative hematology ROS (+)   Anesthesia Other Findings   Reproductive/Obstetrics negative OB ROS                          Anesthesia Physical Anesthesia Plan  ASA: II  Anesthesia Plan: General   Post-op Pain Management:    Induction: Intravenous  Airway Management Planned: Oral ETT  Additional Equipment:   Intra-op Plan:   Post-operative Plan: Extubation in OR  Informed Consent: I have reviewed the patients History and Physical, chart, labs and discussed the procedure including the risks, benefits and alternatives for the proposed anesthesia with the patient or authorized representative who has indicated his/her understanding and acceptance.   Dental advisory given  Plan Discussed with: CRNA  Anesthesia Plan Comments:         Anesthesia Quick Evaluation

## 2013-08-05 NOTE — Addendum Note (Signed)
Addendum created 08/05/13 1810 by Billie Lade, CRNA   Modules edited: Notes Section   Notes Section:  File: [id number]

## 2013-08-05 NOTE — OR Nursing (Signed)
Patient coded at 76. CPR performed for 32minute. Code called.

## 2013-08-05 NOTE — Op Note (Signed)
08/05/2013  12:03 PM  PATIENT:  Laurie Mcneil  58 y.o. female  PRE-OPERATIVE DIAGNOSIS:  Menorrhagia with uterine myomas  POST-OPERATIVE DIAGNOSIS:  Menorrhagia with uterine myomas  PROCEDURE:  Procedure(s): ROBOTIC ASSISTED TOTAL HYSTERECTOMY BILATERAL SALPINGECTOMY  SURGEON:  Surgeon(s): Princess Bruins, MD Elveria Royals, MD  ASSISTANTS: Dr Elveria Royals MD  ANESTHESIA:   general  PROCEDURE:  Under general anesthesia with endotracheal intubation the patient is in lithotomy position. She is prepped with ChloraPrep on the abdomen and with Betadine on the suprapubic, vulvar and vaginal areas.she is draped as usual. A Foley is inserted in the bladder. The vaginal exam reveals a large irregular uterus,no adnexal mass are felt.  The weighted speculum is inserted in the vagina and the anterior lip of the cervix is grasped with a tenaculum. The hysterometry is at 11 cm. A #10 roomy with a medium coring are put in place.  The tenaculum and weighted speculum were removed. We go to the abdomen and infiltrate the supraumbilical area with Marcaine one quarter plain. We make a 1.5 cm supraumbilical incision. The aponeurosis is opened with Mayo scissors under direct vision. The peritoneum is opened bluntly with a finger. A pursestring stitch of Vicryl 0 is applied at the aponeurosis.  The Sheryle Hail is inserted at that level and a pneumoperitoneum is created with CO2. The camera is inserted in that port.  Inspection of the abdominopelvic cavities reveals no adhesion.  The uterus is enlarged with multiple fibroids especially a large one on the right anterior aspect of the uterus.  Both tubes and both ovaries are normal to inspection.  We used a semicircular configuration for port placement with 2 robotic ports on the right and one robotic port on the lower left, the assistant port a 5 mm port is put in place on th left upper side.  At each site,  infiltration of Marcaine one quarter plain was done and a  small incision with the scalpel.  Ports were put in place under direct vision. The robot was docked from the right side. Robotic instruments were inserted under direct vision with the Endo Shears scissor on the right, the PK on the left and the fenestrated clamp in the third arm.  We then go to the console.  The appendix was normal in appearance and a picture was taken. Pictures were taken of on the ovaries and the uterus as well.  We started on the right side with cauterization and section of the right round ligament. We then cauterized and section the right mesosalpinx. We then cauterized and sectioned the right utero-ovarian ligament.  We stopped on that side before getting to the right uterine artery. We proceeded exactly the same way on the left side and then continued anteriorly opening the visceral peritoneum and lowering the bladder passed the coring.  We now finished opening the anterior peritoneum on the right side and lower the bladder further on that side.  We then cauterized the left uterine artery. We then                    cauterized and sectioned the right uterine artery and sectioned it. We then sectioned the left uterine artery.  We opened the vaginal cuff as high as possible over the coring anteriorly, then posteriorly and on each side. We left a little bit of attachment with the vagina while we were sectioning the uterus with the tip of the Endo Shears scissors to reduce the transverse diameter  of it in order to pass it vaginally more easily.  The uterus was finally completely detached from the vagina.  It was passed vaginally with additional morcellation vaginally.  The specimen was sent to pathology.  The occluder was put in the vagina. We switched instruments to the cutting needle driver and the right hand the long tip and the left hand and the PK in the third arm.  We used a V. LOC 9 inches to close the vaginal cuff starting at the right angle and a running towards the left angle of the  vagina.  We used a second V. LOC 9 inches starting at the left angle and running towards the right overlapping the other suture.  An additional suture of Vicryl 0 was used in an X stitch towards the right angle.  Hemostasis was verified and was adequate at all levels.  Irrigation and suction of the abdominopelvic cavities was done.  The robotic instruments were removed. The robot was undocked. We went by laparoscopy. We irrigated and suctioned the abdominopelvic cavities again after removing the patient from deep Trendelenburg. Good hemostasis was confirmed once more.  All instruments were removed. The ports were removed under direct vision. The CO2 was evacuated.  The pursestring stitch was attached at the supraumbilical incision. As this is was completed with the electrocautery on all incisions.  All incisions were closed with a subcuticular stitch of Vicryl 4-0. Dermabond was applied on all incisions. Hemostasis was verified at the vagina and was good.  At that point I left the OR. I was called back in shortly after because the patient had a transient obstruction of the airways after extubation. This was rapidly overcome by inserting a laryngeal mask. The patient was well oxygenated and reconnected to the anesthesia monitoring. The EKG lead was showing a good sinus rhythm.  When I arrived back in the room the patient was completely stable.  She was brought to recovery room in good and stable status.  Because of the transient airway obstruction, a 12-lead EKG and on troponin cardiac enzymes will be done. We will also bring her to  AICU for postop recovery by measure of precaution.    ESTIMATED BLOOD LOSS:  100 cc   Intake/Output Summary (Last 24 hours) at 08/05/13 1203 Last data filed at 08/05/13 1143  Gross per 24 hour  Intake   2000 ml  Output    600 ml  Net   1400 ml     BLOOD ADMINISTERED:none   LOCAL MEDICATIONS USED:  MARCAINE     SPECIMEN:  Source of Specimen:  Uterus/myomas with cervix,  bilateral tubes  DISPOSITION OF SPECIMEN:  PATHOLOGY  COUNTS:  YES  PLAN OF CARE: Transfer to PACU  Princess Bruins MD  08/05/2013 at 12:10 pm

## 2013-08-05 NOTE — OR Nursing (Signed)
SEE ANESTHESIA NOTES

## 2013-08-05 NOTE — Preoperative (Signed)
Beta Blockers   Reason not to administer Beta Blockers:Bblocker taken 08/05/13 @0515 

## 2013-08-06 ENCOUNTER — Encounter (HOSPITAL_COMMUNITY): Payer: Self-pay | Admitting: Obstetrics & Gynecology

## 2013-08-06 LAB — CBC
HCT: 34.1 % — ABNORMAL LOW (ref 36.0–46.0)
HEMOGLOBIN: 12.5 g/dL (ref 12.0–15.0)
MCH: 31 pg (ref 26.0–34.0)
MCHC: 36.7 g/dL — AB (ref 30.0–36.0)
MCV: 84.6 fL (ref 78.0–100.0)
Platelets: 160 10*3/uL (ref 150–400)
RBC: 4.03 MIL/uL (ref 3.87–5.11)
RDW: 13.5 % (ref 11.5–15.5)
WBC: 12.4 10*3/uL — ABNORMAL HIGH (ref 4.0–10.5)

## 2013-08-06 MED ORDER — OXYCODONE-ACETAMINOPHEN 7.5-325 MG PO TABS
1.0000 | ORAL_TABLET | ORAL | Status: DC | PRN
Start: 2013-08-06 — End: 2016-11-26

## 2013-08-06 NOTE — Progress Notes (Signed)
Discharge instructions reviewed w/pt & spouse & prescription given for Percocet

## 2013-08-06 NOTE — Progress Notes (Signed)
Pt. C/o foley catheter being uncomfortable and feeling "urge to urinate" Have manipulated position of foley and flushed several times to get foley to drain large amount only to stop draining and pt. Feeling distended bladder again. Orders received to d/c foley

## 2013-08-06 NOTE — Discharge Instructions (Signed)
Total Laparoscopic Hysterectomy, Care After  Refer to this sheet in the next few weeks. These instructions provide you with information on caring for yourself after your procedure. Your health care provider may also give you more specific instructions. Your treatment has been planned according to current medical practices, but problems sometimes occur. Call your health care provider if you have any problems or questions after your procedure.  WHAT TO EXPECT AFTER THE PROCEDURE  · Pain and bruising at the incision sites. You will be given pain medicine to control it.  · Menopausal symptoms such as hot flashes, night sweats, and insomnia if your ovaries were removed.  · Sore throat from the breathing tube that was inserted during surgery.  HOME CARE INSTRUCTIONS  · Only take over-the-counter or prescription medicines for pain, discomfort, or fever as directed by your health care provider.    · Do not take aspirin. It can cause bleeding.    · Do not drive when taking pain medicine.    · Follow your health care provider's advice regarding diet, exercise, lifting, driving, and general activities.    · Resume your usual diet as directed and allowed.    · Get plenty of rest and sleep.    · Do not douche, use tampons, or have sexual intercourse for at least 6 weeks, or until your health care provider gives you permission.    · Change your bandages (dressings) as directed by your health care provider.    · Monitor your temperature and notify your health care provider of a fever.    · Take showers instead of baths for 2 3 weeks.    · Do not drink alcohol until your health care provider gives you permission.    · If you develop constipation, you may take a mild laxative with your health care provider's permission. Bran foods may help with constipation problems. Drinking enough fluids to keep your urine clear or pale yellow may help as well.    · Try to have someone home with you for 1 2 weeks to help around the house.     · Keep all of your follow-up appointments as directed by your health care provider.    SEEK MEDICAL CARE IF:  · You have swelling, redness, or increasing pain around your incision sites.    · You have pus coming from your incision.    · You notice a bad smell coming from your incision.    · Your incision breaks open.    · You feel dizzy or lightheaded.    · You have pain or bleeding when you urinate.    · You have persistent diarrhea.    · You have persistent nausea and vomiting.    · You have abnormal vaginal discharge.    · You have a rash.    · You have any type of abnormal reaction or develop an allergy to your medicine.    · You have poor pain control with your prescribed medicine.    SEEK IMMEDIATE MEDICAL CARE IF:  · You have chest pain or shortness of breath.  · You have severe abdominal pain that is not relieved with pain medicine.  · You have pain or swelling in your legs.  Document Released: 02/24/2013 Document Reviewed: 11/24/2012  [employer]® Patient Information ©2014 [employer].

## 2013-08-06 NOTE — Progress Notes (Addendum)
1 Day Post-Op Procedure(s) (LRB): ROBOTIC ASSISTED TOTAL HYSTERECTOMY (N/A) BILATERAL SALPINGECTOMY (N/A)  Airway obstruction immediately after extubation. Laryngeal mask inserted promptly with bag ventilation which restored oxygenation with cardiac compressions.  Leads put back in place showing sinus rhythm.  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 H/O Dysrythmia and mild cHTN on Corzide.  Subjective: Patient reports that pain is well managed.  Tolerating normal diet as tolerated  diet without difficulty. No nausea / vomiting.  Ambulating and voiding.  No chest pain, no palpitation.  No SOB.  Objective: BP 101/56  Pulse 59  Temp(Src) 98.3 F (36.8 C) (Oral)  Resp 16  Ht 5\' 2"  (1.575 m)  Wt 82.691 kg (182 lb 4.8 oz)  BMI 33.33 kg/m2  SpO2 100% Lungs: clear Heart: normal rate and rhythm Abdomen:soft and appropriately tender Extremities: Homans sign is negative, no sign of DVT Incision: healing well  Postop CBC:  Hb 12.5                         WBC 12.4 Troponin-1 neg EKG PACU abnormal.  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  Discussed with Cardio PA.  Assessment: s/p Procedure(s): ROBOTIC ASSISTED TOTAL HYSTERECTOMY BILATERAL SALPINGECTOMY: progressing well Post extubation transient airway obstruction Abnormal EKG, Troponin 1 neg.  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  No cardiac Sx.  Plan: Discharge home F/U with Fam MD with information on EKG result for further investigation with Cardiac Echo.  LOS: 1 day    Konstantine Gervasi,MARIE-LYNE 08/06/2013, 11:04 AM

## 2013-08-06 NOTE — Discharge Summary (Addendum)
  Physician Discharge Summary  Patient ID: Laurie Mcneil MRN: [id number] DOB/AGE: [birth year] 58 y.o.  Admit date: 08/05/2013 Discharge date: 08/06/2013  Admission Diagnoses: Menorrhagia, uterine myomas  Discharge Diagnoses: Menorrhagia, uterine myomas                                         Transient Airway obstruction post op        Active Problems:   Post-operative state   Discharged Condition: good  Hospital Course: good  Consults: cardiology for EKG interpretation  Treatments: surgery: Robotic total hysterectomy with bilateral salpingectomies  Disposition: D/C home     Medication List         nadolol-bendroflumethiazide 40-5 MG per tablet  Commonly known as:  CORZIDE  Take 0.5 tablets by mouth daily.     oxyCODONE-acetaminophen 7.5-325 MG per tablet  Commonly known as:  PERCOCET  Take 1 tablet by mouth every 4 (four) hours as needed for pain.     PARoxetine 10 MG tablet  Commonly known as:  PAXIL  Take 5 mg by mouth daily.           Follow-up Information   Follow up with Thien Berka,MARIE-LYNE, MD In 3 weeks.   Specialty:  Obstetrics and Gynecology   Contact information:   [place] [address] [phone]       Signed: Princess Bruins, MD 08/06/2013, 12:01 PM

## 2013-08-06 NOTE — Progress Notes (Signed)
Foley cath. Also sl. "kinked" pt. With much relief after it drained

## 2013-08-12 NOTE — Progress Notes (Signed)
Post discharge chart review completed.  

## 2014-03-21 ENCOUNTER — Encounter (HOSPITAL_COMMUNITY): Payer: Self-pay | Admitting: Obstetrics & Gynecology

## 2014-05-20 HISTORY — PX: COLONOSCOPY: SHX174

## 2016-09-13 ENCOUNTER — Other Ambulatory Visit: Payer: Self-pay | Admitting: Physician Assistant

## 2016-09-13 DIAGNOSIS — R1031 Right lower quadrant pain: Secondary | ICD-10-CM

## 2016-09-13 DIAGNOSIS — R1011 Right upper quadrant pain: Secondary | ICD-10-CM

## 2016-09-20 ENCOUNTER — Ambulatory Visit
Admission: RE | Admit: 2016-09-20 | Discharge: 2016-09-20 | Disposition: A | Discharge status: Home / Self Care | Payer: BC Managed Care – PPO | Source: Ambulatory Visit | Attending: Physician Assistant | Admitting: Physician Assistant

## 2016-09-20 DIAGNOSIS — R1031 Right lower quadrant pain: Secondary | ICD-10-CM

## 2016-09-20 DIAGNOSIS — R1011 Right upper quadrant pain: Secondary | ICD-10-CM

## 2016-09-20 MED ORDER — IOPAMIDOL (ISOVUE-300) INJECTION 61%: 100.0000 mL | Freq: Once | INTRAVENOUS | Status: DC | PRN

## 2016-09-20 MED ORDER — IOHEXOL 300 MG/ML  SOLN
30.0000 mL | Freq: Once | INTRAMUSCULAR | Status: AC | PRN
  Administered 2016-09-20: 30 mL via ORAL

## 2016-09-20 MED ORDER — IOHEXOL 300 MG/ML  SOLN: 30.0000 mL | Freq: Once | INTRAMUSCULAR | Status: DC | PRN

## 2016-10-21 ENCOUNTER — Other Ambulatory Visit: Payer: Self-pay | Admitting: General Surgery

## 2016-11-28 NOTE — Pre-Procedure Instructions (Signed)
Laurie Mcneil  11/28/2016      [id number] - [place], [place] - [address] [place] [address] Phone: [phone] Fax: [phone]    Your procedure is scheduled on December 13, 2016.  Report to [facility] Admitting at 800 AM.  Call this number if you have problems the morning of surgery:  [phone]   Remember:  Do not eat food or drink liquids after midnight.  Take these medicines the morning of surgery with A SIP OF WATER allopurinol (zyloprim), paroxetine (paxil).  7 days prior to surgery STOP taking any Aspirin, Aleve, Naproxen, Ibuprofen, Motrin, Advil, Goody's, BC's, all herbal medications, fish oil, and all vitamins   Do not wear jewelry, make-up or nail polish.  Do not wear lotions, powders, or perfumes, or deoderant.  Do not shave 48 hours prior to surgery.    Do not bring valuables to the hospital.  [facility] is not responsible for any belongings or valuables.  Contacts, dentures or bridgework may not be worn into surgery.  Leave your suitcase in the car.  After surgery it may be brought to your room.  For patients admitted to the hospital, discharge time will be determined by your treatment team.  Patients discharged the day of surgery will not be allowed to drive home.   Special instructions:   [facility]- Preparing For Surgery  Before surgery, you can play an important role. Because skin is not sterile, your skin needs to be as free of germs as possible. You can reduce the number of germs on your skin by washing with CHG (chlorahexidine gluconate) Soap before surgery.  CHG is an antiseptic cleaner which kills germs and bonds with the skin to continue killing germs even after washing.  Please do not use if you have an allergy to CHG or antibacterial soaps. If your skin becomes reddened/irritated stop using the CHG.  Do not shave (including legs and underarms) for at least  48 hours prior to first CHG shower. It is OK to shave your face.  Please follow these instructions carefully.   1. Shower the NIGHT BEFORE SURGERY and the MORNING OF SURGERY with CHG.   2. If you chose to wash your hair, wash your hair first as usual with your normal shampoo.  3. After you shampoo, rinse your hair and body thoroughly to remove the shampoo.  4. Use CHG as you would any other liquid soap. You can apply CHG directly to the skin and wash gently with a scrungie or a clean washcloth.   5. Apply the CHG Soap to your body ONLY FROM THE NECK DOWN.  Do not use on open wounds or open sores. Avoid contact with your eyes, ears, mouth and genitals (private parts). Wash genitals (private parts) with your normal soap.  6. Wash thoroughly, paying special attention to the area where your surgery will be performed.  7. Thoroughly rinse your body with warm water from the neck down.  8. DO NOT shower/wash with your normal soap after using and rinsing off the CHG Soap.  9. Pat yourself dry with a CLEAN TOWEL.   10. Wear CLEAN PAJAMAS   11. Place CLEAN SHEETS on your bed the night of your first shower and DO NOT SLEEP WITH PETS.    Day of Surgery: Do not apply any deodorants/lotions. Please wear clean clothes to the hospital/surgery center.  Please read over the following fact sheets that you were given. Pain Booklet, Coughing and Deep Breathing and Surgical Site Infection Prevention

## 2016-11-29 ENCOUNTER — Inpatient Hospital Stay (HOSPITAL_COMMUNITY)
Admission: RE | Admit: 2016-11-29 | Discharge: 2016-11-29 | Disposition: A | Discharge status: Home / Self Care | Payer: BC Managed Care – PPO | Source: Ambulatory Visit

## 2016-11-29 NOTE — Progress Notes (Signed)
Spoke with [employer], and she sts that her surgery has been rescheduled for 12/13/16 at the [facility], and that she was told by that campus not to come to this appointment.

## 2016-12-05 NOTE — Patient Instructions (Addendum)
Laurie Mcneil  12/05/2016   Your procedure is scheduled on: 12-13-16  Report to [facility] Main  Entrance Take [place]  elevators to 3rd floor to  [place] at 8:30AM.    Call this number if you have problems the morning of surgery [phone]    Remember: ONLY 1 PERSON MAY GO WITH YOU TO SHORT STAY TO GET  READY MORNING OF [place].  Do not eat food or drink liquids :After Midnight.     Take these medicines the morning of surgery with A SIP OF WATER: allopurinol(zyloprim), paroxetine(paxil)                                You may not have any metal on your body including hair pins and              piercings  Do not wear jewelry, make-up, lotions, powders or perfumes, deodorant             Do not wear nail polish.  Do not shave  48 hours prior to surgery.              Men may shave face and neck.   Do not bring valuables to the hospital. [place].  Contacts, dentures or bridgework may not be worn into surgery.       Patients discharged the day of surgery will not be allowed to drive home.  Name and phone number of your driver:  Special Instructions: N/A              Please read over the following fact sheets you were given: _____________________________________________________________________        [facility] - Preparing for Surgery Before surgery, you can play an important role.  Because skin is not sterile, your skin needs to be as free of germs as possible.  You can reduce the number of germs on your skin by washing with CHG (chlorahexidine gluconate) soap before surgery.  CHG is an antiseptic cleaner which kills germs and bonds with the skin to continue killing germs even after washing. Please DO NOT use if you have an allergy to CHG or antibacterial soaps.  If your skin becomes reddened/irritated stop using the CHG and inform your nurse when you arrive at Short Stay. Do not shave  (including legs and underarms) for at least 48 hours prior to the first CHG shower.  You may shave your face/neck. Please follow these instructions carefully:  1.  Shower with CHG Soap the night before surgery and the  morning of Surgery.  2.  If you choose to wash your hair, wash your hair first as usual with your  normal  shampoo.  3.  After you shampoo, rinse your hair and body thoroughly to remove the  shampoo.                           4.  Use CHG as you would any other liquid soap.  You can apply chg directly  to the skin and wash                       Gently with a scrungie  or clean washcloth.  5.  Apply the CHG Soap to your body ONLY FROM THE NECK DOWN.   Do not use on face/ open                           Wound or open sores. Avoid contact with eyes, ears mouth and genitals (private parts).                       Wash face,  Genitals (private parts) with your normal soap.             6.  Wash thoroughly, paying special attention to the area where your surgery  will be performed.  7.  Thoroughly rinse your body with warm water from the neck down.  8.  DO NOT shower/wash with your normal soap after using and rinsing off  the CHG Soap.                9.  Pat yourself dry with a clean towel.            10.  Wear clean pajamas.            11.  Place clean sheets on your bed the night of your first shower and do not  sleep with pets. Day of Surgery : Do not apply any lotions/deodorants the morning of surgery.  Please wear clean clothes to the hospital/surgery center.  FAILURE TO FOLLOW THESE INSTRUCTIONS MAY RESULT IN THE CANCELLATION OF YOUR SURGERY PATIENT SIGNATURE_________________________________  NURSE SIGNATURE__________________________________  ________________________________________________________________________

## 2016-12-06 ENCOUNTER — Encounter (HOSPITAL_COMMUNITY): Payer: Self-pay | Admitting: Emergency Medicine

## 2016-12-06 ENCOUNTER — Encounter (HOSPITAL_COMMUNITY)
Admission: RE | Admit: 2016-12-06 | Discharge: 2016-12-06 | Disposition: A | Discharge status: Home / Self Care | Payer: BC Managed Care – PPO | Source: Ambulatory Visit | Attending: General Surgery | Admitting: General Surgery

## 2016-12-06 DIAGNOSIS — R9431 Abnormal electrocardiogram [ECG] [EKG]: Secondary | ICD-10-CM | POA: Insufficient documentation

## 2016-12-06 DIAGNOSIS — K8012 Calculus of gallbladder with acute and chronic cholecystitis without obstruction: Secondary | ICD-10-CM | POA: Insufficient documentation

## 2016-12-06 DIAGNOSIS — E119 Type 2 diabetes mellitus without complications: Secondary | ICD-10-CM | POA: Insufficient documentation

## 2016-12-06 DIAGNOSIS — I10 Essential (primary) hypertension: Secondary | ICD-10-CM | POA: Insufficient documentation

## 2016-12-06 DIAGNOSIS — Z01818 Encounter for other preprocedural examination: Secondary | ICD-10-CM | POA: Diagnosis present

## 2016-12-06 HISTORY — DX: Adverse effect of carbonic-anhydrase inhibitors, benzothiadiazides and other diuretics, initial encounter: T50.2X5A

## 2016-12-06 HISTORY — DX: Hypokalemia: E87.6

## 2016-12-06 HISTORY — DX: Prediabetes: R73.03

## 2016-12-06 HISTORY — DX: Other complications of anesthesia, initial encounter: T88.59XA

## 2016-12-06 HISTORY — DX: Essential (primary) hypertension: I10

## 2016-12-06 HISTORY — DX: Adverse effect of unspecified anesthetic, initial encounter: T41.45XA

## 2016-12-06 LAB — CBC WITH DIFFERENTIAL/PLATELET
BASOS ABS: 0 10*3/uL (ref 0.0–0.1)
Basophils Relative: 0 %
EOS ABS: 0.2 10*3/uL (ref 0.0–0.7)
Eosinophils Relative: 2 %
HCT: 39 % (ref 36.0–46.0)
HEMOGLOBIN: 13.7 g/dL (ref 12.0–15.0)
LYMPHS ABS: 1.6 10*3/uL (ref 0.7–4.0)
LYMPHS PCT: 24 %
MCH: 29 pg (ref 26.0–34.0)
MCHC: 35.1 g/dL (ref 30.0–36.0)
MCV: 82.5 fL (ref 78.0–100.0)
Monocytes Absolute: 0.6 10*3/uL (ref 0.1–1.0)
Monocytes Relative: 9 %
NEUTROS PCT: 65 %
Neutro Abs: 4.4 10*3/uL (ref 1.7–7.7)
Platelets: 169 10*3/uL (ref 150–400)
RBC: 4.73 MIL/uL (ref 3.87–5.11)
RDW: 13.8 % (ref 11.5–15.5)
WBC: 6.8 10*3/uL (ref 4.0–10.5)

## 2016-12-06 LAB — COMPREHENSIVE METABOLIC PANEL
ALK PHOS: 64 U/L (ref 38–126)
ALT: 13 U/L — AB (ref 14–54)
AST: 23 U/L (ref 15–41)
Albumin: 4.3 g/dL (ref 3.5–5.0)
Anion gap: 9 (ref 5–15)
BUN: 13 mg/dL (ref 6–20)
CALCIUM: 9.6 mg/dL (ref 8.9–10.3)
CHLORIDE: 104 mmol/L (ref 101–111)
CO2: 29 mmol/L (ref 22–32)
CREATININE: 0.78 mg/dL (ref 0.44–1.00)
GFR calc non Af Amer: >60 mL/min (ref >60)
GLUCOSE: 95 mg/dL (ref 65–99)
Potassium: 3.5 mmol/L (ref 3.5–5.1)
SODIUM: 142 mmol/L (ref 135–145)
Total Bilirubin: 2.1 mg/dL — ABNORMAL HIGH (ref 0.3–1.2)
Total Protein: 7 g/dL (ref 6.5–8.1)

## 2016-12-06 LAB — URINALYSIS, ROUTINE W REFLEX MICROSCOPIC
BILIRUBIN URINE: NEGATIVE
Glucose, UA: NEGATIVE mg/dL
Hgb urine dipstick: NEGATIVE
Ketones, ur: NEGATIVE mg/dL
Leukocytes, UA: NEGATIVE
NITRITE: NEGATIVE
PROTEIN: NEGATIVE mg/dL
SPECIFIC GRAVITY, URINE: 1.015 (ref 1.005–1.030)
pH: 6 (ref 5.0–8.0)

## 2016-12-06 NOTE — Progress Notes (Signed)
Surgeon Dr Barry Dienes ordered anaesthesilogy consult , reason for pt history of respiratory arrest after ET tube extubation following hysterectomy 07-2013. RN called and spoke to Dr Jillyn Hidden and informed him of order and notified him of the event. [place]tt inquired of patient disposition today (ie hx sleep apnea, whether obese, and hx of respiratory conditions), patient had no remarkable respiratory issues with exception of prior surgical event. Dr Jillyn Hidden expressed that no other F/U would be necessary today  And that Anesthesia would handle patient as needed day of surgery because likely the respiratory event was and "one time thing" and is doubtful such an event will be repeated. RN informed patient of recommendation' patient verbalized some nervousness but  understanding and agreeable to situation .

## 2016-12-09 NOTE — Progress Notes (Signed)
Time change had already occurred when pt was at pre-op appt. Pt aware that surgery is scheduled to start at 1030am and to arrive at 830am.

## 2016-12-11 NOTE — H&P (Signed)
[place] Location: [facility] Patient #: [id number] DOB: 03-14-1956 Married / Language: English / Race: White Female   History of Present Illness The patient is a 61 year old female who presents for evaluation of gall stones. Patient is a 61 year old female who is referred by PA Anderson Malta Couillard consultation regarding a new diagnosis of gallstones. Around 6 weeks ago the patient went to [place] with a friend. She awoke one night after a very rich meal with the band of pain in her upper abdomen. This resolved after around 12 hours. Since that time, she has had some soreness and tightness in her right upper quadrant. If she leans forward she can feel area of discomfort. She has started awaiting sweets and milk products due to exacerbation of the discomfort. It is not painful that she has taken pain medication. She does have a significant family history of gallbladder disease. She has a maternal grandfather and a mother who required cholecystectomy. Of note, her bilirubin has been elevated for multiple years. It was 2.4 after her workup. She states that this has been true for quite a long time.   The patient had a right upper quadrant ultrasound that showed multiple gallstones. She had no evidence of dilated bile duct. These records are in [employer]. A CBC was normal and CMET was remarkable for a potassium of 3 and a bilirubin of 2.4. The remainder of her LFTs and her amylase were normal. A CT of the pelvis was performed which was negative. At the point that she was having her evaluation by the PA, the pain seemed to be a little bit lower and appendicitis was ruled out.   Past Surgical History Cesarean Section - 1  Colon Polyp Removal - Colonoscopy  Hysterectomy (not due to cancer) - Partial  Oral Surgery  Tonsillectomy  Valve Replacement   Diagnostic Studies History Colonoscopy  1-5 years ago Mammogram  within last year Pap Smear  1-5 years  ago  Allergies No Known Drug Allergies 10/21/2016 Allergies Reconciled   Medication History  Allopurinol (100MG  Tablet, Oral) Active. Klor-Con (8MEQ Tablet ER, Oral) Active. PARoxetine HCl (10MG  Tablet, Oral) Active. Nadolol-Bendroflumethiazide (40-5MG  Tablet, Oral) Active. Medications Reconciled  Social History Alcohol use  Occasional alcohol use. Caffeine use  Coffee. No drug use  Tobacco use  Never smoker.  Family History Arthritis  Father, Mother, Sister. Breast Cancer  Mother. Depression  Mother. Hypertension  Father. Melanoma  Father, Mother. Prostate Cancer  Father.  Pregnancy / Birth History Age at menarche  44 years. Age of menopause  6-60 Gravida  1 Length (months) of breastfeeding  3-6 Maternal age  87-35 Para  1 Regular periods   Other Problems  Anxiety Disorder  Arthritis  Cholelithiasis  General anesthesia - complications  Heart murmur  High blood pressure  Melanoma  Sleep Apnea     Review of Systems  General Not Present- Appetite Loss, Chills, Fatigue, Fever, Night Sweats, Weight Gain and Weight Loss. Skin Not Present- Change in Wart/Mole, Dryness, Hives, Jaundice, New Lesions, Non-Healing Wounds, Rash and Ulcer. HEENT Present- Seasonal Allergies and Wears glasses/contact lenses. Not Present- Earache, Hearing Loss, Hoarseness, Nose Bleed, Oral Ulcers, Ringing in the Ears, Sinus Pain, Sore Throat, Visual Disturbances and Yellow Eyes. Respiratory Present- Wheezing. Not Present- Bloody sputum, Chronic Cough, Difficulty Breathing and Snoring. Breast Not Present- Breast Mass, Breast Pain, Nipple Discharge and Skin Changes. Cardiovascular Present- Palpitations. Not Present- Chest Pain, Difficulty Breathing Lying Down, Leg Cramps, Rapid Heart Rate, Shortness of Breath  and Swelling of Extremities. Gastrointestinal Present- Indigestion. Not Present- Abdominal Pain, Bloating, Bloody Stool, Change in Bowel Habits, Chronic  diarrhea, Constipation, Difficulty Swallowing, Excessive gas, Gets full quickly at meals, Hemorrhoids, Nausea, Rectal Pain and Vomiting. Female Genitourinary Not Present- Frequency, Nocturia, Painful Urination, Pelvic Pain and Urgency. Musculoskeletal Present- Joint Pain and Joint Stiffness. Not Present- Back Pain, Muscle Pain, Muscle Weakness and Swelling of Extremities. Neurological Not Present- Decreased Memory, Fainting, Headaches, Numbness, Seizures, Tingling, Tremor, Trouble walking and Weakness. Psychiatric Present- Anxiety. Not Present- Bipolar, Change in Sleep Pattern, Depression, Fearful and Frequent crying. Endocrine Not Present- Cold Intolerance, Excessive Hunger, Hair Changes, Heat Intolerance, Hot flashes and New Diabetes. Hematology Not Present- Blood Thinners, Easy Bruising, Excessive bleeding, Gland problems, HIV and Persistent Infections.  Vitals Weight: 177.2 lb Temp.: 61F  Pulse: 63 (Regular)  BP: 130/78 (Sitting, Left Arm, Standard)       Physical Exam  General Mental Status-Alert. General Appearance-Consistent with stated age. Hydration-Well hydrated. Voice-Normal.  Head and Neck Head-normocephalic, atraumatic with no lesions or palpable masses. Trachea-midline. Thyroid Gland Characteristics - normal size and consistency.  Eye Eyeball - Bilateral-Extraocular movements intact. Sclera/Conjunctiva - Bilateral-No scleral icterus.  Chest and Lung Exam Chest and lung exam reveals -quiet, even and easy respiratory effort with no use of accessory muscles and on auscultation, normal breath sounds, no adventitious sounds and normal vocal resonance. Inspection Chest Wall - Normal. Back - normal.  Cardiovascular Cardiovascular examination reveals -normal heart sounds, regular rate and rhythm with no murmurs and normal pedal pulses bilaterally.  Abdomen Inspection Inspection of the abdomen reveals - No  Hernias. Palpation/Percussion Palpation and Percussion of the abdomen reveal - Soft, No Rebound tenderness, No Rigidity (guarding) and No hepatosplenomegaly. Note: Mild discomfort is present in the right upper quadrant. Auscultation Auscultation of the abdomen reveals - Bowel sounds normal.  Neurologic Neurologic evaluation reveals -alert and oriented x 3 with no impairment of recent or remote memory. Mental Status-Normal.  Musculoskeletal Global Assessment -Note: no gross deformities.  Normal Exam - Left-Upper Extremity Strength Normal and Lower Extremity Strength Normal. Normal Exam - Right-Upper Extremity Strength Normal and Lower Extremity Strength Normal.  Lymphatic Head & Neck  General Head & Neck Lymphatics: Bilateral - Description - Normal. Axillary  General Axillary Region: Bilateral - Description - Normal. Tenderness - Non Tender. Femoral & Inguinal  Generalized Femoral & Inguinal Lymphatics: Bilateral - Description - No Generalized lymphadenopathy.    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 I advised her that I do not think she will be able to put this off for too long. She has already started to avoid many foods and has episodes of pain.  I reviewed gallbladder surgery with the patient including diagrams of anatomy. I discussed risks of surgery. We will target July or August for surgery.  The recovery was discussed with the patient as well. Current Plans You are being scheduled for surgery- Our schedulers will call you.  You should hear from our office's scheduling department within 5 working days about the location, date, and time of surgery. We try to make accommodations for patient's preferences in scheduling surgery, but sometimes the OR schedule or the surgeon's schedule prevents [place] from making those accommodations.  If you have not  heard from our office [phone]) in 5 working days, call the office and ask for your surgeon's nurse.  If you have other questions about your diagnosis, plan,  or surgery, call the office and ask for your surgeon's nurse.  Pt Education - Pamphlet Given - Laparoscopic Gallbladder Surgery: discussed with patient and provided information.   Signed by Stark Klein, MD

## 2016-12-13 ENCOUNTER — Encounter (HOSPITAL_COMMUNITY)
Admission: RE | Disposition: A | Discharge status: Home / Self Care | Payer: Self-pay | Source: Ambulatory Visit | Attending: General Surgery

## 2016-12-13 ENCOUNTER — Encounter (HOSPITAL_COMMUNITY): Payer: Self-pay | Admitting: *Deleted

## 2016-12-13 ENCOUNTER — Ambulatory Visit (HOSPITAL_COMMUNITY): Payer: BC Managed Care – PPO | Admitting: Certified Registered Nurse Anesthetist

## 2016-12-13 ENCOUNTER — Ambulatory Visit (HOSPITAL_COMMUNITY): Payer: BC Managed Care – PPO

## 2016-12-13 ENCOUNTER — Ambulatory Visit (HOSPITAL_COMMUNITY)
Admission: RE | Admit: 2016-12-13 | Discharge: 2016-12-13 | Disposition: A | Discharge status: Home / Self Care | Payer: BC Managed Care – PPO | Source: Ambulatory Visit | Attending: General Surgery | Admitting: General Surgery

## 2016-12-13 DIAGNOSIS — M1990 Unspecified osteoarthritis, unspecified site: Secondary | ICD-10-CM | POA: Insufficient documentation

## 2016-12-13 DIAGNOSIS — Z86010 Personal history of colonic polyps: Secondary | ICD-10-CM | POA: Diagnosis not present

## 2016-12-13 DIAGNOSIS — Z6832 Body mass index (BMI) 32.0-32.9, adult: Secondary | ICD-10-CM | POA: Insufficient documentation

## 2016-12-13 DIAGNOSIS — F419 Anxiety disorder, unspecified: Secondary | ICD-10-CM | POA: Diagnosis not present

## 2016-12-13 DIAGNOSIS — K819 Cholecystitis, unspecified: Secondary | ICD-10-CM | POA: Diagnosis present

## 2016-12-13 DIAGNOSIS — Z79899 Other long term (current) drug therapy: Secondary | ICD-10-CM | POA: Diagnosis not present

## 2016-12-13 DIAGNOSIS — E669 Obesity, unspecified: Secondary | ICD-10-CM | POA: Diagnosis not present

## 2016-12-13 DIAGNOSIS — I10 Essential (primary) hypertension: Secondary | ICD-10-CM | POA: Insufficient documentation

## 2016-12-13 DIAGNOSIS — Z419 Encounter for procedure for purposes other than remedying health state, unspecified: Secondary | ICD-10-CM

## 2016-12-13 DIAGNOSIS — K8010 Calculus of gallbladder with chronic cholecystitis without obstruction: Secondary | ICD-10-CM | POA: Diagnosis not present

## 2016-12-13 HISTORY — PX: CHOLECYSTECTOMY: SHX55

## 2016-12-13 SURGERY — LAPAROSCOPIC CHOLECYSTECTOMY WITH INTRAOPERATIVE CHOLANGIOGRAM
Anesthesia: General | Site: Abdomen

## 2016-12-13 MED ORDER — LIDOCAINE HCL 1 % IJ SOLN
INTRAMUSCULAR | Status: AC
  Filled 2016-12-13: qty 20

## 2016-12-13 MED ORDER — BUPIVACAINE-EPINEPHRINE 0.25% -1:200000 IJ SOLN
INTRAMUSCULAR | Status: AC
  Filled 2016-12-13: qty 1

## 2016-12-13 MED ORDER — ONDANSETRON HCL 4 MG/2ML IJ SOLN
INTRAMUSCULAR | Status: AC
  Filled 2016-12-13: qty 2

## 2016-12-13 MED ORDER — LACTATED RINGERS IR SOLN
Status: DC | PRN
  Administered 2016-12-13: 1000 mL

## 2016-12-13 MED ORDER — DEXAMETHASONE SODIUM PHOSPHATE 10 MG/ML IJ SOLN
INTRAMUSCULAR | Status: DC | PRN
  Administered 2016-12-13: 8 mg via INTRAVENOUS

## 2016-12-13 MED ORDER — IOPAMIDOL (ISOVUE-300) INJECTION 61%
INTRAVENOUS | Status: DC | PRN
  Administered 2016-12-13: 50 mL via INTRAVENOUS

## 2016-12-13 MED ORDER — PHENYLEPHRINE 40 MCG/ML (10ML) SYRINGE FOR IV PUSH (FOR BLOOD PRESSURE SUPPORT)
PREFILLED_SYRINGE | INTRAVENOUS | Status: AC
  Filled 2016-12-13: qty 10

## 2016-12-13 MED ORDER — PROPOFOL 10 MG/ML IV BOLUS
INTRAVENOUS | Status: DC | PRN
  Administered 2016-12-13: 150 mg via INTRAVENOUS

## 2016-12-13 MED ORDER — PHENYLEPHRINE 40 MCG/ML (10ML) SYRINGE FOR IV PUSH (FOR BLOOD PRESSURE SUPPORT)
PREFILLED_SYRINGE | INTRAVENOUS | Status: DC | PRN
  Administered 2016-12-13: 40 ug via INTRAVENOUS
  Administered 2016-12-13: 80 ug via INTRAVENOUS

## 2016-12-13 MED ORDER — ROCURONIUM BROMIDE 50 MG/5ML IV SOSY
PREFILLED_SYRINGE | INTRAVENOUS | Status: AC
  Filled 2016-12-13: qty 5

## 2016-12-13 MED ORDER — MIDAZOLAM HCL 5 MG/5ML IJ SOLN
INTRAMUSCULAR | Status: DC | PRN
  Administered 2016-12-13: 1 mg via INTRAVENOUS

## 2016-12-13 MED ORDER — SUCCINYLCHOLINE CHLORIDE 200 MG/10ML IV SOSY
PREFILLED_SYRINGE | INTRAVENOUS | Status: AC
  Filled 2016-12-13: qty 10

## 2016-12-13 MED ORDER — MIDAZOLAM HCL 2 MG/2ML IJ SOLN
INTRAMUSCULAR | Status: AC
  Filled 2016-12-13: qty 2

## 2016-12-13 MED ORDER — FENTANYL CITRATE (PF) 250 MCG/5ML IJ SOLN
INTRAMUSCULAR | Status: AC
  Filled 2016-12-13: qty 5

## 2016-12-13 MED ORDER — CEFAZOLIN SODIUM-DEXTROSE 2-4 GM/100ML-% IV SOLN
2.0000 g | INTRAVENOUS | Status: AC
  Administered 2016-12-13: 2 g via INTRAVENOUS
  Filled 2016-12-13: qty 100

## 2016-12-13 MED ORDER — SUGAMMADEX SODIUM 200 MG/2ML IV SOLN
INTRAVENOUS | Status: DC | PRN
  Administered 2016-12-13: 200 mg via INTRAVENOUS

## 2016-12-13 MED ORDER — FENTANYL CITRATE (PF) 100 MCG/2ML IJ SOLN
INTRAMUSCULAR | Status: DC | PRN
  Administered 2016-12-13 (×2): 50 ug via INTRAVENOUS

## 2016-12-13 MED ORDER — IOPAMIDOL (ISOVUE-300) INJECTION 61%
INTRAVENOUS | Status: AC
  Filled 2016-12-13: qty 50

## 2016-12-13 MED ORDER — SUCCINYLCHOLINE CHLORIDE 200 MG/10ML IV SOSY
PREFILLED_SYRINGE | INTRAVENOUS | Status: DC | PRN
  Administered 2016-12-13: 120 mg via INTRAVENOUS

## 2016-12-13 MED ORDER — PROMETHAZINE HCL 25 MG/ML IJ SOLN: 6.2500 mg | INTRAMUSCULAR | Status: DC | PRN

## 2016-12-13 MED ORDER — NADOLOL 40 MG PO TABS
40.0000 mg | ORAL_TABLET | Freq: Once | ORAL | Status: AC
  Administered 2016-12-13: 40 mg via ORAL
  Filled 2016-12-13: qty 1

## 2016-12-13 MED ORDER — PROPOFOL 10 MG/ML IV BOLUS
INTRAVENOUS | Status: AC
  Filled 2016-12-13: qty 20

## 2016-12-13 MED ORDER — ONDANSETRON HCL 4 MG/2ML IJ SOLN
INTRAMUSCULAR | Status: DC | PRN
  Administered 2016-12-13: 4 mg via INTRAVENOUS

## 2016-12-13 MED ORDER — EPHEDRINE 5 MG/ML INJ
INTRAVENOUS | Status: AC
  Filled 2016-12-13: qty 10

## 2016-12-13 MED ORDER — LIDOCAINE 2% (20 MG/ML) 5 ML SYRINGE
INTRAMUSCULAR | Status: AC
  Filled 2016-12-13: qty 5

## 2016-12-13 MED ORDER — OXYCODONE HCL 5 MG PO TABS: 5.0000 mg | ORAL_TABLET | Freq: Once | ORAL | Status: DC | PRN

## 2016-12-13 MED ORDER — OXYCODONE HCL 5 MG PO TABS: 5.0000 mg | ORAL_TABLET | ORAL | 0 refills | Status: DC | PRN

## 2016-12-13 MED ORDER — 0.9 % SODIUM CHLORIDE (POUR BTL) OPTIME
TOPICAL | Status: DC | PRN
  Administered 2016-12-13: 1000 mL

## 2016-12-13 MED ORDER — OXYCODONE HCL 5 MG/5ML PO SOLN
5.0000 mg | Freq: Once | ORAL | Status: DC | PRN
  Filled 2016-12-13: qty 5

## 2016-12-13 MED ORDER — MEPERIDINE HCL 50 MG/ML IJ SOLN: 6.2500 mg | INTRAMUSCULAR | Status: DC | PRN

## 2016-12-13 MED ORDER — GLYCOPYRROLATE 0.2 MG/ML IV SOSY
PREFILLED_SYRINGE | INTRAVENOUS | Status: AC
  Filled 2016-12-13: qty 5

## 2016-12-13 MED ORDER — BUPIVACAINE-EPINEPHRINE (PF) 0.25% -1:200000 IJ SOLN
INTRAMUSCULAR | Status: DC | PRN
  Administered 2016-12-13: 8.5 mL via PERINEURAL

## 2016-12-13 MED ORDER — EPHEDRINE SULFATE-NACL 50-0.9 MG/10ML-% IV SOSY
PREFILLED_SYRINGE | INTRAVENOUS | Status: DC | PRN
  Administered 2016-12-13: 10 mg via INTRAVENOUS

## 2016-12-13 MED ORDER — LIDOCAINE 2% (20 MG/ML) 5 ML SYRINGE
INTRAMUSCULAR | Status: DC | PRN
Start: 2016-12-13 — End: 2016-12-13
  Administered 2016-12-13: 80 mg via INTRAVENOUS

## 2016-12-13 MED ORDER — ACETAMINOPHEN 10 MG/ML IV SOLN
INTRAVENOUS | Status: DC | PRN
  Administered 2016-12-13: 1000 mg via INTRAVENOUS

## 2016-12-13 MED ORDER — LACTATED RINGERS IV SOLN
INTRAVENOUS | Status: DC
  Administered 2016-12-13 (×2): via INTRAVENOUS

## 2016-12-13 MED ORDER — ACETAMINOPHEN 10 MG/ML IV SOLN
INTRAVENOUS | Status: AC
  Filled 2016-12-13: qty 100

## 2016-12-13 MED ORDER — ROCURONIUM BROMIDE 50 MG/5ML IV SOSY
PREFILLED_SYRINGE | INTRAVENOUS | Status: DC | PRN
  Administered 2016-12-13: 40 mg via INTRAVENOUS

## 2016-12-13 MED ORDER — LIDOCAINE HCL (PF) 1 % IJ SOLN
INTRAMUSCULAR | Status: DC | PRN
  Administered 2016-12-13: 8.5 mL

## 2016-12-13 MED ORDER — GLYCOPYRROLATE 0.2 MG/ML IV SOSY
PREFILLED_SYRINGE | INTRAVENOUS | Status: DC | PRN
  Administered 2016-12-13: .2 mg via INTRAVENOUS

## 2016-12-13 MED ORDER — SUGAMMADEX SODIUM 200 MG/2ML IV SOLN
INTRAVENOUS | Status: AC
  Filled 2016-12-13: qty 2

## 2016-12-13 MED ORDER — HYDROMORPHONE HCL-NACL 0.5-0.9 MG/ML-% IV SOSY: 0.2500 mg | PREFILLED_SYRINGE | INTRAVENOUS | Status: DC | PRN

## 2016-12-13 SURGICAL SUPPLY — 44 items
ADH SKN CLS APL DERMABOND .7 (GAUZE/BANDAGES/DRESSINGS) ×1
APPLIER CLIP ROT 10 11.4 M/L (STAPLE) ×3
APR CLP MED LRG 11.4X10 (STAPLE) ×1
BAG SPEC RTRVL LRG 6X4 10 (ENDOMECHANICALS) ×1
CHLORAPREP W/TINT 26ML (MISCELLANEOUS) ×3 IMPLANT
CLIP APPLIE ROT 10 11.4 M/L (STAPLE) ×1 IMPLANT
CLIP LIGATING HEMO O LOK GREEN (MISCELLANEOUS) ×2 IMPLANT
COVER MAYO STAND STRL (DRAPES) ×2 IMPLANT
COVER SURGICAL LIGHT HANDLE (MISCELLANEOUS) ×3 IMPLANT
DECANTER SPIKE VIAL GLASS SM (MISCELLANEOUS) ×3 IMPLANT
DERMABOND ADVANCED (GAUZE/BANDAGES/DRESSINGS) ×2
DERMABOND ADVANCED .7 DNX12 (GAUZE/BANDAGES/DRESSINGS) IMPLANT
DRAPE C-ARM 42X120 X-RAY (DRAPES) ×2 IMPLANT
ELECT L-HOOK LAP 45CM DISP (ELECTROSURGICAL)
ELECT PENCIL ROCKER SW 15FT (MISCELLANEOUS) ×3 IMPLANT
ELECT REM PT RETURN 15FT ADLT (MISCELLANEOUS) ×3 IMPLANT
ELECTRODE L-HOOK LAP 45CM DISP (ELECTROSURGICAL) IMPLANT
ENDOLOOP SUT PDS II  0 18 (SUTURE) ×2
ENDOLOOP SUT PDS II 0 18 (SUTURE) IMPLANT
GLOVE BIO SURGEON STRL SZ 6 (GLOVE) ×3 IMPLANT
GLOVE INDICATOR 6.5 STRL GRN (GLOVE) ×3 IMPLANT
GOWN STRL REUS W/TWL 2XL LVL3 (GOWN DISPOSABLE) ×3 IMPLANT
GOWN STRL REUS W/TWL XL LVL3 (GOWN DISPOSABLE) ×6 IMPLANT
HEMOSTAT SNOW SURGICEL 2X4 (HEMOSTASIS) IMPLANT
IRRIG SUCT STRYKERFLOW 2 WTIP (MISCELLANEOUS)
IRRIGATION SUCT STRKRFLW 2 WTP (MISCELLANEOUS) IMPLANT
KIT BASIN OR (CUSTOM PROCEDURE TRAY) ×3 IMPLANT
L-HOOK LAP DISP 36CM (ELECTROSURGICAL) ×3
LHOOK LAP DISP 36CM (ELECTROSURGICAL) IMPLANT
POSITIONER SURGICAL ARM (MISCELLANEOUS) IMPLANT
POUCH SPECIMEN RETRIEVAL 10MM (ENDOMECHANICALS) ×3 IMPLANT
SCISSORS LAP 5X35 DISP (ENDOMECHANICALS) ×3 IMPLANT
SET CHOLANGIOGRAPH MIX (MISCELLANEOUS) ×2 IMPLANT
SET IRRIG TUBING LAPAROSCOPIC (IRRIGATION / IRRIGATOR) ×3 IMPLANT
SLEEVE XCEL OPT CAN 5 100 (ENDOMECHANICALS) ×3 IMPLANT
SUT MNCRL AB 4-0 PS2 18 (SUTURE) ×3 IMPLANT
TAPE CLOTH 4X10 WHT NS (GAUZE/BANDAGES/DRESSINGS) IMPLANT
TOWEL OR 17X26 10 PK STRL BLUE (TOWEL DISPOSABLE) ×3 IMPLANT
TOWEL OR NON WOVEN STRL DISP B (DISPOSABLE) ×3 IMPLANT
TRAY LAPAROSCOPIC (CUSTOM PROCEDURE TRAY) ×3 IMPLANT
TROCAR BLADELESS OPT 5 100 (ENDOMECHANICALS) ×3 IMPLANT
TROCAR XCEL BLUNT TIP 100MML (ENDOMECHANICALS) ×3 IMPLANT
TROCAR XCEL NON-BLD 11X100MML (ENDOMECHANICALS) ×3 IMPLANT
TUBING INSUF HEATED (TUBING) IMPLANT

## 2016-12-13 NOTE — Transfer of Care (Signed)
Immediate Anesthesia Transfer of Care Note  Patient: Laurie Mcneil  Procedure(s) Performed: Procedure(s): LAPAROSCOPIC CHOLECYSTECTOMY WITH INTRAOPERATIVE CHOLANGIOGRAM (N/A)  Patient Location: PACU  Anesthesia Type:General  Level of Consciousness:  sedated, patient cooperative and responds to stimulation  Airway & Oxygen Therapy:Patient Spontanous Breathing and Patient connected to face mask oxgen  Post-op Assessment:  Report given to PACU RN and Post -op Vital signs reviewed and stable  Post vital signs:  Reviewed and stable  Last Vitals:  Vitals:   12/13/16 0830 12/13/16 0857  BP: (!) 147/81   Pulse: (!) 57 68  Resp: 18 16  Temp: 16.1 C     Complications: No apparent anesthesia complications

## 2016-12-13 NOTE — Discharge Instructions (Addendum)
[place] Office Phone Number [phone]   POST OP INSTRUCTIONS  Always review your discharge instruction sheet given to you by the facility where your surgery was performed.  IF YOU HAVE DISABILITY OR FAMILY LEAVE FORMS, YOU MUST BRING THEM TO THE OFFICE FOR PROCESSING.  DO NOT GIVE THEM TO YOUR DOCTOR.  1. A prescription for pain medication may be given to you upon discharge.  Take your pain medication as prescribed, if needed.  If narcotic pain medicine is not needed, then you may take acetaminophen (Tylenol) or ibuprofen (Advil) as needed. 2. Take your usually prescribed medications unless otherwise directed 3. If you need a refill on your pain medication, please contact your pharmacy.  They will contact our office to request authorization.  Prescriptions will not be filled after 5pm or on week-ends. 4. You should eat very light the first 24 hours after surgery, such as soup, crackers, pudding, etc.  Resume your normal diet the day after surgery 5. It is common to experience some constipation if taking pain medication after surgery.  Increasing fluid intake and taking a stool softener will usually help or prevent this problem from occurring.  A mild laxative (Milk of Magnesia or Miralax) should be taken according to package directions if there are no bowel movements after 48 hours. 6. You may shower in 48 hours.  The surgical glue will flake off in 2-3 weeks.   7. ACTIVITIES:  No strenuous activity or heavy lifting for 2 weeks.   a. You may drive when you no longer are taking prescription pain medication, you can comfortably wear a seatbelt, and you can safely maneuver your car and apply brakes. b. RETURN TO WORK:  _________1-2 weeks_______________ Laurie Mcneil should see your doctor in the office for a follow-up appointment approximately three-four weeks after your surgery.    WHEN TO CALL YOUR DOCTOR: 1. Fever over 101.0 2. Nausea and/or vomiting. 3. Extreme swelling or  bruising. 4. Continued bleeding from incision. 5. Increased pain, redness, or drainage from the incision.  The clinic staff is available to answer your questions during regular business hours.  Please dont hesitate to call and ask to speak to one of the nurses for clinical concerns.  If you have a medical emergency, go to the nearest emergency room or call 911.  A surgeon from [facility] is always on call at the hospital.  For further questions, please visit [email or web address]    General Anesthesia, Adult, Care After These instructions provide you with information about caring for yourself after your procedure. Your health care provider may also give you more specific instructions. Your treatment has been planned according to current medical practices, but problems sometimes occur. Call your health care provider if you have any problems or questions after your procedure. What can I expect after the procedure? After the procedure, it is common to have:  Vomiting.  A sore throat.  Mental slowness.  It is common to feel:  Nauseous.  Cold or shivery.  Sleepy.  Tired.  Sore or achy, even in parts of your body where you did not have surgery.  Follow these instructions at home: For at least 24 hours after the procedure:  Do not: ? Participate in activities where you could fall or become injured. ? Drive. ? Use heavy machinery. ? Drink alcohol. ? Take sleeping pills or medicines that cause drowsiness. ? Make important decisions or sign legal documents. ? Take care of children on your own.  Rest. Eating and  drinking  If you vomit, drink water, juice, or soup when you can drink without vomiting.  Drink enough fluid to keep your urine clear or pale yellow.  Make sure you have little or no nausea before eating solid foods.  Follow the diet recommended by your health care provider. General instructions  Have a responsible adult stay with you until you  are awake and alert.  Return to your normal activities as told by your health care provider. Ask your health care provider what activities are safe for you.  Take over-the-counter and prescription medicines only as told by your health care provider.  If you smoke, do not smoke without supervision.  Keep all follow-up visits as told by your health care provider. This is important. Contact a health care provider if:  You continue to have nausea or vomiting at home, and medicines are not helpful.  You cannot drink fluids or start eating again.  You cannot urinate after 8-12 hours.  You develop a skin rash.  You have fever.  You have increasing redness at the site of your procedure. Get help right away if:  You have difficulty breathing.  You have chest pain.  You have unexpected bleeding.  You feel that you are having a life-threatening or urgent problem. This information is not intended to replace advice given to you by your health care provider. Make sure you discuss any questions you have with your health care provider. Document Released: 08/12/2000 Document Revised: 10/09/2015 Document Reviewed: 04/20/2015 Elsevier Interactive Patient Education  [employer].

## 2016-12-13 NOTE — Anesthesia Preprocedure Evaluation (Signed)
Anesthesia Evaluation  Patient identified by MRN, date of birth, ID band Patient awake    Reviewed: Allergy & Precautions, H&P , NPO status , Patient's Chart, lab work & pertinent test results  History of Anesthesia Complications (+) history of anesthetic complications  Airway Mallampati: II  TM Distance: >3 FB Neck ROM: Full    Dental  (+) Dental Advisory Given   Pulmonary neg pulmonary ROS,    breath sounds clear to auscultation       Cardiovascular hypertension, Pt. on home beta blockers and Pt. on medications + dysrhythmias  Rhythm:Regular Rate:Normal     Neuro/Psych PSYCHIATRIC DISORDERS Anxiety negative neurological ROS     GI/Hepatic negative GI ROS, Neg liver ROS,   Endo/Other  negative endocrine ROS  Renal/GU negative Renal ROS     Musculoskeletal negative musculoskeletal ROS (+)   Abdominal (+) + obese,   Peds  Hematology negative hematology ROS (+)   Anesthesia Other Findings   Reproductive/Obstetrics negative OB ROS                             Anesthesia Physical  Anesthesia Plan  ASA: II  Anesthesia Plan: General   Post-op Pain Management:    Induction: Intravenous  PONV Risk Score and Plan: 3 and Ondansetron, Dexamethasone, Propofol and Midazolam  Airway Management Planned: Oral ETT  Additional Equipment:   Intra-op Plan:   Post-operative Plan: Extubation in OR  Informed Consent: I have reviewed the patients History and Physical, chart, labs and discussed the procedure including the risks, benefits and alternatives for the proposed anesthesia with the patient or authorized representative who has indicated his/her understanding and acceptance.   Dental advisory given  Plan Discussed with: CRNA  Anesthesia Plan Comments:         Anesthesia Quick Evaluation

## 2016-12-13 NOTE — Anesthesia Postprocedure Evaluation (Signed)
Anesthesia Post Note  Patient: Laurie Mcneil  Procedure(s) Performed: Procedure(s) (LRB): LAPAROSCOPIC CHOLECYSTECTOMY WITH INTRAOPERATIVE CHOLANGIOGRAM (N/A)     Patient location during evaluation: PACU Anesthesia Type: General Level of consciousness: sedated and patient cooperative Pain management: pain level controlled Vital Signs Assessment: post-procedure vital signs reviewed and stable Respiratory status: spontaneous breathing Cardiovascular status: stable Anesthetic complications: no    Last Vitals:  Vitals:   12/13/16 1446 12/13/16 1517  BP: 132/72 (!) 147/74  Pulse: 60 68  Resp: 12 16  Temp: 36.6 C 36.4 C    Last Pain:  Vitals:   12/13/16 1517  TempSrc: Oral  PainSc: [place]

## 2016-12-13 NOTE — Op Note (Signed)
Laparoscopic Cholecystectomy with IOC Procedure Note  Indications: This patient presents with symptomatic cholecystitis and will undergo laparoscopic cholecystectomy.  She also has a history of an elevated bilirubin with negative workup.    Pre-operative Diagnosis: see above  Post-operative Diagnosis: Same  Surgeon: Stark Klein   Assistants: none  Anesthesia: General endotracheal anesthesia and local  ASA Class: 2  Procedure Details  The patient was seen again in the Holding Room. The risks, benefits, complications, treatment options, and expected outcomes were discussed with the patient. The possibilities of  bleeding, recurrent infection, damage to nearby structures, the need for additional procedures, failure to diagnose a condition, the possible need to convert to an open procedure, and creating a complication requiring transfusion or operation were discussed with the patient. The likelihood of improving the patient's symptoms with return to their baseline status is good.    The patient and/or family concurred with the proposed plan, giving informed consent. The site of surgery properly noted. The patient was taken to Operating Room, and the procedure verified as Laparoscopic Cholecystectomy with Intraoperative Cholangiogram. A Time Out was held and the above information confirmed.  Prior to the induction of general anesthesia, antibiotic prophylaxis was administered. General endotracheal anesthesia was then administered and tolerated well. After the induction, the abdomen was prepped with Chloraprep and draped in the sterile fashion. The patient was positioned in the supine position.  Local anesthetic agent was injected into the skin near the umbilicus and an incision made. We dissected down to the abdominal fascia with blunt dissection.  The fascia was incised vertically and we entered the peritoneal cavity bluntly.  A pursestring suture of 0-Vicryl was placed around the fascial  opening.  The Hasson cannula was inserted and secured with the stay suture.  Pneumoperitoneum was then created with CO2 and tolerated well without any adverse changes in the patient's vital signs. An 11-mm port was placed in the subxiphoid position.  Two 5-mm ports were placed in the right upper quadrant. All skin incisions were infiltrated with a local anesthetic agent before making the incision and placing the trocars.   We positioned the patient in reverse Trendelenburg, tilted slightly to the patient's left.  The gallbladder was identified, the fundus grasped and retracted cephalad. Adhesions were lysed bluntly and with the electrocautery where indicated, taking care not to injure any adjacent organs or viscus. The infundibulum was grasped and retracted laterally, exposing the peritoneum overlying the triangle of Calot. This was then divided and exposed in a blunt fashion.  The artery and node were anterior and were clipped . A critical view of the cystic duct and cystic artery was obtained.  The cystic duct was clearly identified and bluntly dissected circumferentially. The cystic duct was ligated with a clip distally.   An incision was made in the cystic duct and the [facility] cholangiogram catheter introduced. The catheter was secured using a clip. The cystic duct pulled apart, but the catheter was still in it.   A cholangiogram was then performed, demonstrating normal anatomy and no obstruction.  The cystic duct was then ligated with an Endoloop and 2 clips.    The gallbladder was dissected from the liver bed in retrograde fashion with the electrocautery. The gallbladder was removed and placed in an Endocatch bag.  The gallbladder and Endocatch bag were then removed through the umbilical port site.  The liver bed was irrigated and inspected. Hemostasis was achieved with the electrocautery. Copious irrigation was utilized and was repeatedly aspirated until clear.  We again inspected the right upper  quadrant for hemostasis.  Pneumoperitoneum was released as we removed the trocars.   The pursestring suture was used to close the umbilical fascia.  4-0 Monocryl was used to close the skin.   The skin was cleaned and dry, and Dermabond was applied. The patient was then extubated and brought to the recovery room in stable condition. Instrument, sponge, and needle counts were correct at closure and at the conclusion of the case.   Findings: Mild chronic inflammation.    Estimated Blood Loss: min         Drains: none          Specimens: Gallbladder to pathology       Complications: None; patient tolerated the procedure well.         Disposition: PACU - hemodynamically stable.         Condition: stable

## 2016-12-13 NOTE — Anesthesia Preprocedure Evaluation (Signed)
Anesthesia Evaluation  Patient identified by MRN, date of birth, ID band Patient awake    Reviewed: Allergy & Precautions, H&P , NPO status , Patient's Chart, lab work & pertinent test results  History of Anesthesia Complications (+) history of anesthetic complications (apnea in PACU )  Airway Mallampati: II  TM Distance: >3 FB Neck ROM: Full    Dental  (+) Dental Advisory Given   Pulmonary neg pulmonary ROS,    breath sounds clear to auscultation       Cardiovascular hypertension, Pt. on home beta blockers and Pt. on medications + dysrhythmias  Rhythm:Regular Rate:Normal  ECG: SB, rate 52. LAD, LVH   Neuro/Psych PSYCHIATRIC DISORDERS Anxiety negative neurological ROS     GI/Hepatic negative GI ROS, Neg liver ROS,   Endo/Other  negative endocrine ROS  Renal/GU negative Renal ROS     Musculoskeletal negative musculoskeletal ROS (+)   Abdominal (+) + obese,   Peds  Hematology negative hematology ROS (+)   Anesthesia Other Findings Gout  Reproductive/Obstetrics                             Anesthesia Physical  Anesthesia Plan  ASA: II  Anesthesia Plan: General   Post-op Pain Management:    Induction: Intravenous  PONV Risk Score and Plan: 3 and Ondansetron, Dexamethasone, Propofol and Midazolam  Airway Management Planned: Oral ETT  Additional Equipment:   Intra-op Plan:   Post-operative Plan: Extubation in OR  Informed Consent: I have reviewed the patients History and Physical, chart, labs and discussed the procedure including the risks, benefits and alternatives for the proposed anesthesia with the patient or authorized representative who has indicated his/her understanding and acceptance.   Dental advisory given  Plan Discussed with: CRNA  Anesthesia Plan Comments:         Anesthesia Quick Evaluation

## 2016-12-13 NOTE — Anesthesia Procedure Notes (Signed)
Procedure Name: Intubation Date/Time: 12/13/2016 11:57 AM Performed by: Montel Clock Pre-anesthesia Checklist: Patient identified, Emergency Drugs available, Suction available, Patient being monitored and Timeout performed Patient Re-evaluated:Patient Re-evaluated prior to induction Oxygen Delivery Method: Circle system utilized Preoxygenation: Pre-oxygenation with 100% oxygen Induction Type: IV induction Ventilation: Mask ventilation without difficulty and Oral airway inserted - appropriate to patient size Laryngoscope Size: Mac and 3 Grade View: Grade II Tube type: Oral Tube size: 7.5 mm Number of attempts: 1 Airway Equipment and Method: Stylet Placement Confirmation: ETT inserted through vocal cords under direct vision,  positive ETCO2 and breath sounds checked- equal and bilateral Secured at: 21 cm Tube secured with: Tape Dental Injury: Teeth and Oropharynx as per pre-operative assessment  Comments: Grade IIb view, ETT easily passed with downward laryngeal pressure.

## 2016-12-13 NOTE — Interval H&P Note (Signed)
History and Physical Interval Note:  12/13/2016 11:44 AM  Laurie Mcneil  has presented today for surgery, with the diagnosis of chronic calculous cholecystitis  The various methods of treatment have been discussed with the patient and family. After consideration of risks, benefits and other options for treatment, the patient has consented to  Procedure(s): LAPAROSCOPIC CHOLECYSTECTOMY WITH INTRAOPERATIVE CHOLANGIOGRAM (N/A) as a surgical intervention .  The patient's history has been reviewed, patient examined, no change in status, stable for surgery.  I have reviewed the patient's chart and labs.  Questions were answered to the patient's satisfaction.     Amear Strojny

## 2016-12-18 LAB — HEMOGLOBIN A1C
HEMOGLOBIN A1C: 5.1 % (ref 4.8–5.6)
Mean Plasma Glucose: 100 mg/dL

## 2021-06-25 ENCOUNTER — Other Ambulatory Visit: Payer: Self-pay

## 2021-06-25 ENCOUNTER — Ambulatory Visit: Payer: BC Managed Care – PPO | Admitting: Cardiology

## 2021-06-25 ENCOUNTER — Encounter: Payer: Self-pay | Admitting: Cardiology

## 2021-06-25 VITALS — BP 134/82 | HR 60 | Temp 98.3°F | Resp 17 | Ht 62.0 in | Wt 178.6 lb

## 2021-06-25 DIAGNOSIS — Z8774 Personal history of (corrected) congenital malformations of heart and circulatory system: Secondary | ICD-10-CM

## 2021-06-25 DIAGNOSIS — D6859 Other primary thrombophilia: Secondary | ICD-10-CM

## 2021-06-25 DIAGNOSIS — I10 Essential (primary) hypertension: Secondary | ICD-10-CM

## 2021-06-25 DIAGNOSIS — R9431 Abnormal electrocardiogram [ECG] [EKG]: Secondary | ICD-10-CM

## 2021-06-25 MED ORDER — LOSARTAN POTASSIUM 50 MG PO TABS: 50.0000 mg | ORAL_TABLET | Freq: Every day | ORAL | 2 refills | Status: DC

## 2021-06-25 NOTE — Progress Notes (Addendum)
Primary Physician/Referring:  Nickola Major, MD  Patient ID: Laurie Mcneil, female    DOB: 21-Dec-1955, 66 y.o.   MRN: [id number]  No chief complaint on file.  HPI:    Laurie Mcneil  is a 66 y.o. Caucasian female patient with history of coarctation of aorta repair in 34 at age 66 years, primary hypertension, primary hypercoagulable state with spontaneous DVT and on chronic anticoagulation and followed by hematology along with mild chronic thrombocytopenia, patient has antiphospholipid antibody positive and factor V Leiden mutation.  She is referred to me for evaluation of abnormal EKG and to establish with cardiology in view of prior cardiac history.  Patient is asymptomatic, however has been sedentary.  Denies chest pain, dizziness, syncope, claudication  Past Medical History:  Diagnosis Date   Anxiety    Cancer ([place])    SKIN   Complication of anesthesia    extubation in 2015 resulted in incidental apnea; see postprocedural anesthesia note for 07-26-2013   Diuretic-induced hypokalemia    Dysrhythmia    takes combo corzide for tx    Hypertension    Pre-diabetes    Past Surgical History:  Procedure Laterality Date   ADENOIDECTOMY W/ MYRINGOTOMY  age 66   BILATERAL SALPINGECTOMY N/A 08/05/2013   Procedure: BILATERAL SALPINGECTOMY;  Surgeon: Princess Bruins, MD;  Location: [facility];  Service: Gynecology;  Laterality: N/A;   CESAREAN SECTION  1994   CHOLECYSTECTOMY N/A 12/13/2016   Procedure: LAPAROSCOPIC CHOLECYSTECTOMY WITH INTRAOPERATIVE CHOLANGIOGRAM;  Surgeon: Stark Klein, MD;  Location: [facility];  Service: General;  Laterality: N/A;   COARCTATION OF AORTA REPAIR  04/1970   AGE 2    COARCTATION OF AORTA REPAIR     [place] at age 66   COLONOSCOPY  2016   ENDOMETRIAL ABLATION     ROBOTIC ASSISTED TOTAL HYSTERECTOMY N/A 08/05/2013   Procedure: ROBOTIC ASSISTED TOTAL HYSTERECTOMY;  Surgeon: Princess Bruins, MD;  Location: [facility];  Service: Gynecology;   Laterality: N/A;   TONSILLECTOMY     Family History  Problem Relation Age of Onset   Breast cancer Mother 64   Pneumonia Father 32   Pneumonia Sister 49   Esophageal cancer Sister     Social History   Tobacco Use   Smoking status: Never   Smokeless tobacco: Never  Substance Use Topics   Alcohol use: Yes    Comment: social   Marital Status: Married  ROS  Review of Systems  Cardiovascular:  Negative for chest pain, dyspnea on exertion and leg swelling.  Objective  Blood pressure 134/82, pulse 60, temperature 98.3 F (36.8 C), temperature source Temporal, resp. rate 17, height '5\' 2"'  (1.575 m), weight 178 lb 9.6 oz (81 kg), SpO2 96 %. Body mass index is 32.67 kg/m.  Vitals with BMI 06/25/2021 12/13/2016 12/13/2016  Height '5\' 2"'  - -  Weight 178 lbs 10 oz - -  BMI 68.61 - -  Systolic 683 729 021  Diastolic 82 74 72  Pulse 60 68 60    Physical Exam Neck:     Vascular: No carotid bruit or JVD.  Cardiovascular:     Rate and Rhythm: Normal rate and regular rhythm.     Pulses: Intact distal pulses.     Heart sounds: Normal heart sounds. No murmur heard.   No gallop.  Pulmonary:     Effort: Pulmonary effort is normal.     Breath sounds: Normal breath sounds.  Abdominal:     General: Bowel sounds  are normal.     Palpations: Abdomen is soft.  Musculoskeletal:     Right lower leg: No edema.     Left lower leg: No edema.     Medications and allergies  No Known Allergies   Medication list after today's encounter   Current Outpatient Medications  Medication Instructions   albuterol (VENTOLIN HFA) 108 (90 Base) MCG/ACT inhaler Inhalation   allopurinol (ZYLOPRIM) 100 mg, Oral, 2 times daily   Biotin w/ Vitamins C & E (HAIR SKIN & NAILS GUMMIES PO) 2 each, Oral, Daily   carvedilol (COREG) 12.5 mg, Oral, 2 times daily   Eliquis 5 mg, Oral, 2 times daily   KLOR-CON 8 MEQ tablet 8 mEq, Oral, Daily   latanoprost (XALATAN) 0.005 % ophthalmic solution 1 drop, Both Eyes, Daily    losartan (COZAAR) 50 mg, Oral, Daily   magnesium oxide (MAG-OX) 400 MG tablet 1 tablet, Oral, 2 times daily   Multiple Vitamin (MULTIVITAMIN) capsule 1 capsule, Oral, Daily   timolol (TIMOPTIC) 0.5 % ophthalmic solution 1 drop, Both Eyes, Daily    Laboratory examination:   External labs:   Labs 06/06/2021:  Sodium 142, potassium 3.6, BUN 11, creatinine 0.74, EGFR 90,, LFTs normal.  Total cholesterol 171, triglycerides 114, HDL 42, LDL 130.  Non-HDL cholesterol 129.  Hepatitis C antibody nonreactive.  Magnesium 1.8.  TSH normal at 2.54.  Hb 14.2/HCT 41.0, platelets 135.  Antiphospholipid antibody elevated at 82 (<15).  Factor V Leyden mutation positive, heterozygous.  Radiology:     Cardiac Studies:  NA  EKG:   EKG 06/25/2021: Normal sinus rhythm at rate of 50 bpm, left axis deviation, left anterior fascicular block.  IVCD.  LVH with repolarization abnormality.    Assessment     ICD-10-CM   1. Primary hypertension  I10 EKG 12-Lead    losartan (COZAAR) 50 MG tablet    Basic metabolic panel    2. History of aortic coarctation repair 1971 at [facility] (at age 42 Years)  Z87.74 PCV ANKLE BRACHIAL INDEX (ABI)    PCV ECHOCARDIOGRAM COMPLETE    3. Nonspecific abnormal electrocardiogram (ECG) (EKG)  R94.31 PCV ECHOCARDIOGRAM COMPLETE    DG Chest 2 View    4. Primary hypercoagulable state ([place]) antiphospholipid antibody and factor V Leiden mutation positive  D68.59       Meds ordered this encounter  Medications   losartan (COZAAR) 50 MG tablet    Sig: Take 1 tablet (50 mg total) by mouth daily.    Dispense:  30 tablet    Refill:  2    Discontinue HCTZ   Orders Placed This Encounter  Procedures   DG Chest 2 View    Standing Status:   Future    Standing Expiration Date:   08/23/2021    Order Specific Question:   Reason for Exam (SYMPTOM  OR DIAGNOSIS REQUIRED)    Answer:   H/O coarcatation of aorta repair    Order Specific Question:   Preferred imaging location?     Answer:   [facility]    Order Specific Question:   Radiology Contrast Protocol - do NOT remove file path    Answer:   [email or web address]   Basic metabolic panel   EKG 24-MPNT   PCV ECHOCARDIOGRAM COMPLETE    Standing Status:   Future    Standing Expiration Date:   06/25/2022   Recommendations:   Laurie Mcneil is a 66 y.o.  Caucasian female patient with history of coarctation of aorta  repair in 1971 at age 59 years, primary hypertension, primary hypercoagulable state with spontaneous DVT and on chronic anticoagulation and followed by hematology along with mild chronic thrombocytopenia, patient has antiphospholipid antibody positive and factor V Leiden mutation.  She is referred to me for evaluation of abnormal EKG and to establish with cardiology in view of prior cardiac history.  She is asymptomatic I reviewed her chart from 2015 patient was told to have had a cardiac arrest, upon review she had airway obstruction which was transient.  It appears that she has had excellent repair of the coarctation.  Her upper and lower extremity pulses appear to be equal.  I would like to obtain ABI and I will also an echocardiogram.  Abnormal EKG is related to longstanding hypertension single palpitation as well.  However blood pressure is relatively well controlled on beta-blocker therapy and HCTZ but will change from HCTZ to losartan in view of aortopathy that can exist with coarctation of the aorta.  Reviewed her labs, lipids are mildly elevated, she is trying to make lifestyle changes and I suspect when weight loss, LDL may come to goal.  I will see her back in 6 weeks for follow-up.  Upper extremity and lower extremity pulses/cap    Adrian Prows, MD, [facility] 06/25/2021, 10:17 AM Office: [phone]

## 2021-06-26 ENCOUNTER — Ambulatory Visit
Admission: RE | Admit: 2021-06-26 | Discharge: 2021-06-26 | Disposition: A | Discharge status: Home / Self Care | Payer: BC Managed Care – PPO | Source: Ambulatory Visit | Attending: Cardiology | Admitting: Cardiology

## 2021-06-26 DIAGNOSIS — R9431 Abnormal electrocardiogram [ECG] [EKG]: Secondary | ICD-10-CM

## 2021-06-28 NOTE — Progress Notes (Signed)
Chest x-ray PA and lateral view 06/27/2021: A surgical clip is projected over the superior mediastinum consistent with reported history. Heart size is borderline to mildly enlarged. The hila are normal. No pneumothorax. No nodules or masses. No focal infiltrates. No overt edema.

## 2021-07-05 ENCOUNTER — Ambulatory Visit: Payer: BC Managed Care – PPO

## 2021-07-05 ENCOUNTER — Other Ambulatory Visit: Payer: Self-pay

## 2021-07-05 DIAGNOSIS — R9431 Abnormal electrocardiogram [ECG] [EKG]: Secondary | ICD-10-CM

## 2021-07-05 DIAGNOSIS — Z8774 Personal history of (corrected) congenital malformations of heart and circulatory system: Secondary | ICD-10-CM

## 2021-07-14 ENCOUNTER — Encounter: Payer: Self-pay | Admitting: Cardiology

## 2021-07-17 ENCOUNTER — Other Ambulatory Visit: Payer: Self-pay

## 2021-07-17 ENCOUNTER — Ambulatory Visit: Payer: BC Managed Care – PPO

## 2021-07-17 ENCOUNTER — Telehealth: Payer: Self-pay | Admitting: Cardiology

## 2021-07-17 DIAGNOSIS — I10 Essential (primary) hypertension: Secondary | ICD-10-CM

## 2021-07-17 DIAGNOSIS — Z8774 Personal history of (corrected) congenital malformations of heart and circulatory system: Secondary | ICD-10-CM

## 2021-07-17 NOTE — Telephone Encounter (Signed)
Patient has echo today, no order in the system. Can someone please put an order in?

## 2021-07-27 NOTE — Telephone Encounter (Signed)
From patient.

## 2021-07-30 NOTE — Telephone Encounter (Signed)
From patient.

## 2021-08-02 LAB — BASIC METABOLIC PANEL
BUN/Creatinine Ratio: 22 (ref 12–28)
BUN: 18 mg/dL (ref 8–27)
CO2: 27 mmol/L (ref 20–29)
Calcium: 10 mg/dL (ref 8.7–10.3)
Chloride: 102 mmol/L (ref 96–106)
Creatinine, Ser: 0.81 mg/dL (ref 0.57–1.00)
Glucose: 97 mg/dL (ref 70–99)
Potassium: 3.8 mmol/L (ref 3.5–5.2)
Sodium: 143 mmol/L (ref 134–144)
eGFR: 81 mL/min/{1.73_m2} (ref >59)

## 2021-08-05 NOTE — Progress Notes (Signed)
? ?Primary Physician/Referring:  Eksir, Samantha A, MD ? ?Patient ID: Laurie Mcneil, female    DOB: 02/18/1956, 66 y.o.   MRN: [id number] ? ?Chief Complaint  ?Patient presents with  ? Hypertension  ? Abnormal ECG  ? Follow-up  ?  6 WEEKS  ? ?HPI:   ? ?Laurie Mcneil  is a 66 y.o. Caucasian female patient patient with history of coarctation of aorta repair in 1971 at age 13 years, primary hypertension, primary hypercoagulable state with spontaneous DVT and on chronic anticoagulation and followed by hematology along with mild chronic thrombocytopenia, patient has antiphospholipid antibody positive and factor V Leiden mutation. ? ?I seen her about 6 to 8 weeks ago, she underwent echocardiogram and presents for follow-up.  She is tolerating losartan.  After discontinuing hydrochlorothiazide her blood pressure had increased, she is back on hydrochlorothiazide as well. ? ?Patient is asymptomatic, however has been sedentary.  Denies chest pain, dizziness, syncope, claudication ? ?Past Medical History:  ?Diagnosis Date  ? Anxiety   ? Cancer (HCC)   ? SKIN  ? Complication of anesthesia   ? extubation in 2015 resulted in incidental apnea; see postprocedural anesthesia note for 07-26-2013  ? Diuretic-induced hypokalemia   ? Dysrhythmia   ? takes combo corzide for tx   ? Hypertension   ? Pre-diabetes   ? ?Family History  ?Problem Relation Age of Onset  ? Breast cancer Mother 67  ? Pneumonia Father 89  ? Pneumonia Sister 46  ? Esophageal cancer Sister   ?  ?Social History  ? ?Tobacco Use  ? Smoking status: Never  ? Smokeless tobacco: Never  ?Substance Use Topics  ? Alcohol use: Not Currently  ?  Comment: social  ? ?Marital Status: Married  ?ROS  ?Review of Systems  ?Cardiovascular:  Negative for chest pain, dyspnea on exertion and leg swelling.  ?Objective  ?Blood pressure (!) 149/83, pulse (!) 52, temperature 98 ?F (36.7 ?C), resp. rate 16, height 5' 2" (1.575 m), weight 177 lb (80.3 kg), SpO2 97 %. Body mass index is 32.37 kg/m?.   ?Vitals with BMI 08/06/2021 06/25/2021 12/13/2016  ?Height 5' 2" 5' 2" -  ?Weight 177 lbs 178 lbs 10 oz -  ?BMI 32.37 32.66 -  ?Systolic 149 134 147  ?Diastolic 83 82 74  ?Pulse 52 60 68  ?  ?Physical Exam ?Neck:  ?   Vascular: No carotid bruit or JVD.  ?Cardiovascular:  ?   Rate and Rhythm: Normal rate and regular rhythm.  ?   Pulses: Intact distal pulses.  ?   Heart sounds: Normal heart sounds. No murmur heard. ?  No gallop.  ?Pulmonary:  ?   Effort: Pulmonary effort is normal.  ?   Breath sounds: Normal breath sounds.  ?Abdominal:  ?   General: Bowel sounds are normal.  ?   Palpations: Abdomen is soft.  ?Musculoskeletal:  ?   Right lower leg: No edema.  ?   Left lower leg: No edema.  ?  ?Medications and allergies  ?No Known Allergies  ? ?Medication list after today's encounter  ? ?Current Outpatient Medications:  ?  albuterol (VENTOLIN HFA) 108 (90 Base) MCG/ACT inhaler, Inhale into the lungs., Disp: , Rfl:  ?  allopurinol (ZYLOPRIM) 100 MG tablet, Take 100 mg by mouth 2 (two) times daily., Disp: , Rfl:  ?  Biotin w/ Vitamins C & E (HAIR SKIN & NAILS GUMMIES PO), Take 2 each by mouth daily., Disp: , Rfl:  ?  ELIQUIS 5   MG TABS tablet, Take 5 mg by mouth 2 (two) times daily., Disp: , Rfl:  ?  latanoprost (XALATAN) 0.005 % ophthalmic solution, Place 1 drop into both eyes daily., Disp: , Rfl:  ?  losartan-hydrochlorothiazide (HYZAAR) 100-25 MG tablet, Take 1 tablet by mouth daily., Disp: 100 tablet, Rfl: 3 ?  Multiple Vitamin (MULTIVITAMIN) capsule, Take 1 capsule by mouth daily., Disp: , Rfl:  ?  timolol (TIMOPTIC) 0.5 % ophthalmic solution, Place 1 drop into both eyes daily., Disp: , Rfl:  ?  carvedilol (COREG) 6.25 MG tablet, Take 1 tablet (6.25 mg total) by mouth 2 (two) times daily., Disp: 200 tablet, Rfl: 3  ? ?Laboratory examination:  ? ?CMP Latest Ref Rng & Units 08/01/2021 12/06/2016 07/29/2013  ?Glucose 70 - 99 mg/dL 97 95 107(H)  ?BUN 8 - 27 mg/dL 18 13 15  ?Creatinine 0.57 - 1.00 mg/dL 0.81 0.78 0.69  ?Sodium  134 - 144 mmol/L 143 142 144  ?Potassium 3.5 - 5.2 mmol/L 3.8 3.5 3.2(L)  ?Chloride 96 - 106 mmol/L 102 104 102  ?CO2 20 - 29 mmol/L 27 29 30  ?Calcium 8.7 - 10.3 mg/dL 10.0 9.6 9.9  ?Total Protein 6.5 - 8.1 g/dL - 7.0 -  ?Total Bilirubin 0.3 - 1.2 mg/dL - 2.1(H) -  ?Alkaline Phos 38 - 126 U/L - 64 -  ?AST 15 - 41 U/L - 23 -  ?ALT 14 - 54 U/L - 13(L) -  ? ? ?External labs:  ? ?Labs 06/06/2021: ? ?Sodium 142, potassium 3.6, BUN 11, creatinine 0.74, EGFR 90,, LFTs normal. ? ?Total cholesterol 171, triglycerides 114, HDL 42, LDL 130.  Non-HDL cholesterol 129. ? ?Hepatitis C antibody nonreactive.  Magnesium 1.8.  TSH normal at 2.54. ? ?Hb 14.2/HCT 41.0, platelets 135. ? ?Antiphospholipid antibody elevated at 82 (<15).  Factor V Leyden mutation positive, heterozygous. ? ?Radiology:  ? ?Chest x-ray PA and lateral view 06/27/2021: ?A surgical clip is projected over the superior mediastinum consistent with reported history. Heart size is borderline to mildly ?enlarged. The hila are normal. No pneumothorax. No nodules or masses. No focal infiltrates. No overt edema. ? ?Cardiac Studies:  ? ?PCV ECHOCARDIOGRAM COMPLETE 07/05/2021  ?Normal LV systolic function with visual EF 55-60%. Left ventricle cavity is normal in size. Moderate concentric hypertrophy of the left ventricle. Normal global wall motion. Normal diastolic filling pattern. ?Left atrial cavity is moderately dilated by volume. ?Structurally normal mitral valve.  Mild (Grade I) mitral regurgitation. ?Structurally normal tricuspid valve. No evidence of tricuspid stenosis. Moderate tricuspid regurgitation. Moderate pulmonary hypertension. RVSP measures 58 mmHg. ?Pericardium is normal. Insignificant pericardial effusion. ?The aortic root is normal. There is coarctation of the aorta distal to the left subclavian artery. Arch measures 3.20 cm and coarctation measures 1.3 cm.  Peak PG of 11 mm Hg across the coarctation repair. H/O coarctation of aorta repair in 1971. ? ?ABI  07/05/2021: ?This exam reveals normal perfusion of the right lower extremity (ABI 1.27) and normal perfusion of the left lower extremity (ABI 1.21). ?There is normal triphasic waveform pattern at the level of the ankles.  ?   ?EKG:  ? ?EKG 08/06/2021: Normal sinus rhythm at the rate of 48 bpm, left atrial enlargement, left axis deviation, left anterior fascicular block.  Incomplete left bundle branch block, LVH.  Nonspecific T abnormality.  No significant change from 06/25/2021. ? ?Assessment  ? ?  ICD-10-CM   ?1. History of aortic coarctation repair  Z87.74 EKG 12-Lead  ?  losartan-hydrochlorothiazide (HYZAAR) 100-25 MG   tablet  ?  ?2. Primary hypertension  I10 carvedilol (COREG) 6.25 MG tablet  ?  losartan-hydrochlorothiazide (HYZAAR) 100-25 MG tablet  ?  ?3. Primary hypercoagulable state (HCC) antiphospholipid antibody and factor V Leiden mutation positive  D68.59   ?  ?  ?Meds ordered this encounter  ?Medications  ? carvedilol (COREG) 6.25 MG tablet  ?  Sig: Take 1 tablet (6.25 mg total) by mouth 2 (two) times daily.  ?  Dispense:  200 tablet  ?  Refill:  3  ? losartan-hydrochlorothiazide (HYZAAR) 100-25 MG tablet  ?  Sig: Take 1 tablet by mouth daily.  ?  Dispense:  100 tablet  ?  Refill:  3  ? ?Orders Placed This Encounter  ?Procedures  ? EKG 12-Lead  ? ?Recommendations:  ? ?Anajulia W Burnham is a 65 y.o.  Caucasian female patient with history of coarctation of aorta repair in 1971 at age 13 years, primary hypertension, primary hypercoagulable state with spontaneous DVT and on chronic anticoagulation and followed by hematology along with mild chronic thrombocytopenia, patient has antiphospholipid antibody positive and factor V Leiden mutation. ? ?I seen her about 6 to 8 weeks ago, she underwent echocardiogram and presents for follow-up.  She is tolerating losartan.  After discontinuing hydrochlorothiazide her blood pressure had increased, she is back on hydrochlorothiazide as well. ? ?I reviewed the results of the  echocardiogram, excellent repair of the coarctation of the aorta.  No significant pressure gradient across the coarctation repair and ABI is normal.  Blood pressure is equal in both arms. ? ?In view o

## 2021-08-06 ENCOUNTER — Ambulatory Visit: Payer: BC Managed Care – PPO | Admitting: Cardiology

## 2021-08-06 ENCOUNTER — Encounter: Payer: Self-pay | Admitting: Cardiology

## 2021-08-06 ENCOUNTER — Other Ambulatory Visit: Payer: Self-pay

## 2021-08-06 VITALS — BP 149/83 | HR 52 | Temp 98.0°F | Resp 16 | Ht 62.0 in | Wt 177.0 lb

## 2021-08-06 DIAGNOSIS — Z8774 Personal history of (corrected) congenital malformations of heart and circulatory system: Secondary | ICD-10-CM

## 2021-08-06 DIAGNOSIS — I10 Essential (primary) hypertension: Secondary | ICD-10-CM

## 2021-08-06 DIAGNOSIS — D6859 Other primary thrombophilia: Secondary | ICD-10-CM

## 2021-08-06 MED ORDER — LOSARTAN POTASSIUM-HCTZ 100-25 MG PO TABS: 1.0000 | ORAL_TABLET | Freq: Every day | ORAL | 3 refills | Status: DC

## 2021-08-06 MED ORDER — CARVEDILOL 6.25 MG PO TABS: 6.2500 mg | ORAL_TABLET | Freq: Two times a day (BID) | ORAL | 3 refills | Status: DC

## 2021-08-06 NOTE — Telephone Encounter (Signed)
From patient.

## 2021-09-19 ENCOUNTER — Other Ambulatory Visit: Payer: Self-pay | Admitting: Cardiology

## 2021-09-19 DIAGNOSIS — I10 Essential (primary) hypertension: Secondary | ICD-10-CM

## 2021-11-02 ENCOUNTER — Ambulatory Visit: Payer: BC Managed Care – PPO | Admitting: Cardiology

## 2021-11-05 ENCOUNTER — Ambulatory Visit: Payer: BC Managed Care – PPO | Admitting: Cardiology

## 2021-11-11 ENCOUNTER — Other Ambulatory Visit: Payer: Self-pay | Admitting: Cardiology

## 2021-11-11 DIAGNOSIS — I10 Essential (primary) hypertension: Secondary | ICD-10-CM

## 2021-11-29 ENCOUNTER — Other Ambulatory Visit: Payer: Self-pay | Admitting: Cardiology

## 2021-11-29 ENCOUNTER — Encounter: Payer: Self-pay | Admitting: Cardiology

## 2021-11-29 DIAGNOSIS — I10 Essential (primary) hypertension: Secondary | ICD-10-CM

## 2021-11-29 DIAGNOSIS — Z8774 Personal history of (corrected) congenital malformations of heart and circulatory system: Secondary | ICD-10-CM

## 2021-11-30 LAB — BASIC METABOLIC PANEL
BUN/Creatinine Ratio: 20 (ref 12–28)
BUN: 15 mg/dL (ref 8–27)
CO2: 25 mmol/L (ref 20–29)
Calcium: 9.5 mg/dL (ref 8.7–10.3)
Chloride: 101 mmol/L (ref 96–106)
Creatinine, Ser: 0.75 mg/dL (ref 0.57–1.00)
Glucose: 108 mg/dL — ABNORMAL HIGH (ref 70–99)
Potassium: 3.9 mmol/L (ref 3.5–5.2)
Sodium: 146 mmol/L — ABNORMAL HIGH (ref 134–144)
eGFR: 89 mL/min/{1.73_m2} (ref >59)

## 2021-11-30 LAB — LIPID PANEL WITH LDL/HDL RATIO
Cholesterol, Total: 148 mg/dL (ref 100–199)
HDL: 37 mg/dL — ABNORMAL LOW (ref >39)
LDL Chol Calc (NIH): 82 mg/dL (ref 0–99)
LDL/HDL Ratio: 2.2 ratio (ref 0.0–3.2)
Triglycerides: 166 mg/dL — ABNORMAL HIGH (ref 0–149)
VLDL Cholesterol Cal: 29 mg/dL (ref 5–40)

## 2021-12-06 ENCOUNTER — Encounter: Payer: Self-pay | Admitting: Cardiology

## 2021-12-06 ENCOUNTER — Ambulatory Visit: Payer: BC Managed Care – PPO | Admitting: Cardiology

## 2021-12-06 VITALS — BP 130/63 | HR 88 | Temp 97.0°F | Resp 16 | Ht 62.0 in | Wt 171.2 lb

## 2021-12-06 DIAGNOSIS — I10 Essential (primary) hypertension: Secondary | ICD-10-CM

## 2021-12-06 DIAGNOSIS — D6859 Other primary thrombophilia: Secondary | ICD-10-CM

## 2021-12-06 DIAGNOSIS — R002 Palpitations: Secondary | ICD-10-CM

## 2021-12-06 DIAGNOSIS — Z8774 Personal history of (corrected) congenital malformations of heart and circulatory system: Secondary | ICD-10-CM

## 2021-12-06 MED ORDER — CARVEDILOL 3.125 MG PO TABS: 3.1250 mg | ORAL_TABLET | Freq: Two times a day (BID) | ORAL | 3 refills | Status: DC

## 2021-12-06 NOTE — Progress Notes (Signed)
Primary Physician/Referring:  Nickola Major, MD  Patient ID: Laurie Mcneil, female    DOB: August 25, 1955, 66 y.o.   MRN: [id number]  Chief Complaint  Patient presents with   Hypertension   Bradycardia        Follow-up   HPI:    Laurie Mcneil  is a 66 y.o. Caucasian female patient with history of coarctation of aorta repair in 89 at age 66 years, primary hypertension, primary hypercoagulable state with spontaneous DVT and on chronic anticoagulation and followed by hematology along with mild chronic thrombocytopenia, patient has antiphospholipid antibody positive and factor V Leiden mutation.  I had seen him last on 08/06/2021 at which time due to marked sinus bradycardia I reduce the dose of carvedilol from 12.5 mg to 6.25 mg in view of fatigue as well and eventually reduced her to 3.125 mg twice daily.  Patient is feeling the best she has in quite a while, has started to exercise and feels much more energetic.  She has started to swim at least 2-3 times a week as well.  No chest pain no dyspnea.  Past Medical History:  Diagnosis Date   Anxiety    Cancer ([place])    SKIN   Complication of anesthesia    extubation in 2015 resulted in incidental apnea; see postprocedural anesthesia note for 07-26-2013   Diuretic-induced hypokalemia    Dysrhythmia    takes combo corzide for tx    Hypertension    Pre-diabetes    Family History  Problem Relation Age of Onset   Breast cancer Mother 22   Pneumonia Father 32   Pneumonia Sister 41   Esophageal cancer Sister     Social History   Tobacco Use   Smoking status: Never   Smokeless tobacco: Never  Substance Use Topics   Alcohol use: Not Currently    Comment: social   Marital Status: Married  ROS  Review of Systems  Cardiovascular:  Negative for chest pain, dyspnea on exertion and leg swelling.   Objective  Blood pressure 130/63, pulse 88, temperature (!) 97 F (36.1 C), resp. rate 16, height _0  (1.575 m), weight 171 lb  3.2 oz (77.7 kg), SpO2 98 %. Body mass index is 31.31 kg/m.     12/06/2021    2:47 PM 08/06/2021   10:49 AM 06/25/2021    9:13 AM  Vitals with BMI  Height _1  _2  _3   Weight 171 lbs 3 oz 177 lbs 178 lbs 10 oz  BMI 31.31 86.38 17.71  Systolic 165 790 383  Diastolic 63 83 82  Pulse 88 52 60    Physical Exam Neck:     Vascular: No carotid bruit or JVD.  Cardiovascular:     Rate and Rhythm: Normal rate and regular rhythm.     Pulses: Intact distal pulses.     Heart sounds: Normal heart sounds. No murmur heard.    No gallop.  Pulmonary:     Effort: Pulmonary effort is normal.     Breath sounds: Normal breath sounds.  Abdominal:     General: Bowel sounds are normal.     Palpations: Abdomen is soft.  Musculoskeletal:     Right lower leg: No edema.     Left lower leg: No edema.     Medications and allergies  Not on File   Medication list after today's encounter   Current Outpatient Medications:    allopurinol (ZYLOPRIM) 100 MG tablet, Take 100 mg  by mouth 2 (two) times daily., Disp: , Rfl:    Biotin w/ Vitamins C & E (HAIR SKIN & NAILS GUMMIES PO), Take 2 each by mouth daily., Disp: , Rfl:    ELIQUIS 5 MG TABS tablet, Take 5 mg by mouth 2 (two) times daily., Disp: , Rfl:    losartan-hydrochlorothiazide (HYZAAR) 100-25 MG tablet, Take 1 tablet by mouth daily., Disp: 100 tablet, Rfl: 3   Multiple Vitamin (MULTIVITAMIN) capsule, Take 1 capsule by mouth daily., Disp: , Rfl:    timolol (TIMOPTIC) 0.5 % ophthalmic solution, Place 1 drop into both eyes daily., Disp: , Rfl:    albuterol (VENTOLIN HFA) 108 (90 Base) MCG/ACT inhaler, Inhale into the lungs., Disp: , Rfl:    carvedilol (COREG) 3.125 MG tablet, Take 1 tablet (3.125 mg total) by mouth 2 (two) times daily., Disp: 180 tablet, Rfl: 3   Laboratory examination:   Lab Results  Component Value Date   NA 146 (H) 11/29/2021   K 3.9 11/29/2021   CO2 25 11/29/2021   GLUCOSE 108 (H) 11/29/2021   BUN 15 11/29/2021    CREATININE 0.75 11/29/2021   CALCIUM 9.5 11/29/2021   EGFR 89 11/29/2021   GFRNONAA >60 12/06/2016       Latest Ref Rng & Units 11/29/2021   11:06 AM 08/01/2021   12:15 PM 12/06/2016    3:02 PM  CMP  Glucose 70 - 99 mg/dL 108  97  95   BUN 8 - 27 mg/dL _0 Creatinine 0.57 - 1.00 mg/dL 0.75  0.81  0.78   Sodium 134 - 144 mmol/L 146  143  142   Potassium 3.5 - 5.2 mmol/L 3.9  3.8  3.5   Chloride 96 - 106 mmol/L 101  102  104   CO2 20 - 29 mmol/L _1 Calcium 8.7 - 10.3 mg/dL 9.5  10.0  9.6   Total Protein 6.5 - 8.1 g/dL   7.0   Total Bilirubin 0.3 - 1.2 mg/dL   2.1   Alkaline Phos 38 - 126 U/L   64   AST 15 - 41 U/L   23   ALT 14 - 54 U/L   13    Lab Results  Component Value Date   CHOL 148 11/29/2021   HDL 37 (L) 11/29/2021   LDLCALC 82 11/29/2021   TRIG 166 (H) 11/29/2021    External labs:   Labs 06/06/2021:  Sodium 142, potassium 3.6, BUN 11, creatinine 0.74, EGFR 90,, LFTs normal.  Total cholesterol 171, triglycerides 114, HDL 42, LDL 130.  Non-HDL cholesterol 129.  Hepatitis C antibody nonreactive.  Magnesium 1.8.  TSH normal at 2.54.  Hb 14.2/HCT 41.0, platelets 135.  Antiphospholipid antibody elevated at 82 (<15).  Factor V Leyden mutation positive, heterozygous.  Radiology:   Chest x-ray PA and lateral view 06/27/2021: A surgical clip is projected over the superior mediastinum consistent with reported history. Heart size is borderline to mildly enlarged. The hila are normal. No pneumothorax. No nodules or masses. No focal infiltrates. No overt edema.  Cardiac Studies:   PCV ECHOCARDIOGRAM COMPLETE 07/05/2021  Normal LV systolic function with visual EF 55-60%. Left ventricle cavity is normal in size. Moderate concentric hypertrophy of the left ventricle. Normal global wall motion. Normal diastolic filling pattern. Left atrial cavity is moderately dilated by volume. Structurally normal mitral valve.  Mild (Grade I) mitral  regurgitation. Structurally normal tricuspid valve. No evidence of tricuspid  stenosis. Moderate tricuspid regurgitation. Moderate pulmonary hypertension. RVSP measures 58 mmHg. Pericardium is normal. Insignificant pericardial effusion. The aortic root is normal. There is coarctation of the aorta distal to the left subclavian artery. Arch measures 3.20 cm and coarctation measures 1.3 cm.  Peak PG of 11 mm Hg across the coarctation repair. H/O coarctation of aorta repair in 1971.  ABI 07/05/2021: This exam reveals normal perfusion of the right lower extremity (ABI 1.27) and normal perfusion of the left lower extremity (ABI 1.21). There is normal triphasic waveform pattern at the level of the ankles.     EKG:   EKG 12/06/2021: Sinus bradycardia at rate of 53 bpm, left atrial enlargement, left axis deviation, left anterior fascicular block.  IVCD, LVH.  Poor R progression, cannot exclude anterolateral infarct old.  No significant change from 08/06/2021.  Heart rate has improved from 48 bpm.  Assessment     ICD-10-CM   1. History of aortic coarctation repair  Z87.74 CT ANGIO CHEST AORTA W/CM & OR WO/CM    2. Primary hypertension  I10 EKG 12-Lead    carvedilol (COREG) 3.125 MG tablet    3. Primary hypercoagulable state ([place]) antiphospholipid antibody and factor V Leiden mutation positive  D68.59     4. Palpitations  R00.2 carvedilol (COREG) 3.125 MG tablet      Meds ordered this encounter  Medications   carvedilol (COREG) 3.125 MG tablet    Sig: Take 1 tablet (3.125 mg total) by mouth 2 (two) times daily.    Dispense:  180 tablet    Refill:  3   Orders Placed This Encounter  Procedures   CT ANGIO CHEST AORTA W/CM & OR WO/CM    Standing Status:   Future    Standing Expiration Date:   12/07/2022    Order Specific Question:   If indicated for the ordered procedure, I authorize the administration of contrast media per Radiology protocol    Answer:   Yes    Order Specific Question:    Preferred imaging location?    Answer:   [id number]   EKG 12-Lead   Recommendations:   [place] Andre[place] is a 66 y.o.  Caucasian female patient with history of coarctation of aorta repair in 8 at age 39 years, primary hypertension, primary hypercoagulable state with spontaneous DVT and on chronic anticoagulation and followed by hematology along with mild chronic thrombocytopenia, patient has antiphospholipid antibody positive and factor V Leiden mutation.  I had seen him last on 08/06/2021 at which time due to marked sinus bradycardia I reduce the dose of carvedilol from 12.5 mg to 6.25 mg in view of fatigue as well and eventually reduced her to 3.125 mg twice daily.  Patient is feeling the best she has in quite a while, has started to exercise and feels much more energetic.  She has started to swim at least 2-3 times a week as well.  No chest pain no dyspnea.  She has been very strict with her diet and has lost about 8 to 9 pounds since last office visit.  Blood pressure is under excellent control as well.  I reviewed her lipids, LDL has decreased from 132 to < 100.  Overall I am very pleased with her progress.  Her markedly abnormal EKG is related to coarctation of the aorta and hypertension.  She is now tolerating losartan HCT without any side effects.  I would like to obtain CT angiogram of the chest to evaluate her repair as it  was remotely done.  Patient would like to wait for now, I will schedule it for next year.  With regard to hypercoagulable state she is tolerating Eliquis, continue the same.  I will see her back in a year.   Adrian Prows, MD, [facility] 12/06/2021, 5:52 PM Office: [phone]

## 2022-07-24 ENCOUNTER — Other Ambulatory Visit: Payer: Self-pay | Admitting: Cardiology

## 2022-07-24 DIAGNOSIS — I10 Essential (primary) hypertension: Secondary | ICD-10-CM

## 2022-07-24 DIAGNOSIS — Z8774 Personal history of (corrected) congenital malformations of heart and circulatory system: Secondary | ICD-10-CM

## 2022-10-16 ENCOUNTER — Encounter: Payer: Self-pay | Admitting: Cardiology

## 2022-10-16 ENCOUNTER — Other Ambulatory Visit: Payer: Self-pay

## 2022-10-16 DIAGNOSIS — F418 Other specified anxiety disorders: Secondary | ICD-10-CM

## 2022-10-16 DIAGNOSIS — Z8774 Personal history of (corrected) congenital malformations of heart and circulatory system: Secondary | ICD-10-CM

## 2022-10-16 MED ORDER — DIAZEPAM 10 MG PO TABS: 10.0000 mg | ORAL_TABLET | ORAL | 0 refills | Status: DC

## 2022-10-16 NOTE — Telephone Encounter (Signed)
Spoke with patient and gave her number to call for CT Angio information.  She would like for you to call something in for her to help relax before the procedure. Pharmacy is [facility] [address] in [place].

## 2022-10-16 NOTE — Telephone Encounter (Signed)
ICD-10-CM   1. Situational anxiety  F41.8 diazepam (VALIUM) 10 MG tablet     No orders of the defined types were placed in this encounter.   Meds ordered this encounter  Medications   diazepam (VALIUM) 10 MG tablet    Sig: Take 1 tablet (10 mg total) by mouth as directed. One tab prior night and if needed in the morning take half to one tab prior to procedure    Dispense:  2 tablet    Refill:  0

## 2022-10-21 ENCOUNTER — Other Ambulatory Visit: Payer: Self-pay | Admitting: Cardiology

## 2022-10-21 DIAGNOSIS — I10 Essential (primary) hypertension: Secondary | ICD-10-CM

## 2022-10-21 DIAGNOSIS — R002 Palpitations: Secondary | ICD-10-CM

## 2022-11-12 ENCOUNTER — Encounter: Payer: Self-pay | Admitting: Cardiology

## 2022-11-18 ENCOUNTER — Ambulatory Visit
Admission: RE | Admit: 2022-11-18 | Discharge: 2022-11-18 | Disposition: A | Discharge status: Home / Self Care | Payer: Medicare PPO | Source: Ambulatory Visit | Attending: Cardiology | Admitting: Cardiology

## 2022-11-18 DIAGNOSIS — Z8774 Personal history of (corrected) congenital malformations of heart and circulatory system: Secondary | ICD-10-CM

## 2022-11-18 MED ORDER — IOPAMIDOL (ISOVUE-370) INJECTION 76%
75.0000 mL | Freq: Once | INTRAVENOUS | Status: AC | PRN
  Administered 2022-11-18: 75 mL via INTRAVENOUS

## 2022-11-20 NOTE — Progress Notes (Signed)
CT angiogram thoracic aorta 11/20/2022: Postsurgical changes after aortic coarctation repair with gradual tapering of distal aortic arch which is otherwise patent without complicating features. Minimal fusiform ectasia of the descending thoracic aorta measuring up to 3.5 cm.  NON-VASCULAR:  1. No acute intrathoracic abnormality. 2. There is an approximately 1.4 cm hypoattenuating thyroid nodule, with a nodule biopsied in 2006. The correlation prior biopsy results consider a thyroid ultrasound as clinically indicated.

## 2022-12-09 ENCOUNTER — Encounter: Payer: Self-pay | Admitting: Cardiology

## 2022-12-09 ENCOUNTER — Ambulatory Visit: Payer: Medicare PPO | Admitting: Cardiology

## 2022-12-09 VITALS — BP 131/76 | HR 55 | Resp 16 | Ht 62.0 in | Wt 175.4 lb

## 2022-12-09 DIAGNOSIS — Z8774 Personal history of (corrected) congenital malformations of heart and circulatory system: Secondary | ICD-10-CM

## 2022-12-09 DIAGNOSIS — I10 Essential (primary) hypertension: Secondary | ICD-10-CM

## 2022-12-09 DIAGNOSIS — D6859 Other primary thrombophilia: Secondary | ICD-10-CM

## 2022-12-09 NOTE — Progress Notes (Signed)
Primary Physician/Referring:  Gwenlyn Found, MD  Patient ID: Laurie Mcneil, female    DOB: [birth year], 67 y.o.   MRN: [id number]  Chief Complaint  Patient presents with   History of aortic coarctation repair   Primary hypertension   Follow-up   HPI:    Laurie Mcneil  is a 67 y.o. Caucasian female patient with history of coarctation of aorta repair in 26 at age 24 years, primary hypertension, primary hypercoagulable state due to antiphospholipid antibody positive and factor V Leiden mutation with spontaneous DVT and on chronic anticoagulation and followed by hematology along with mild chronic thrombocytopenia.  She is asymptomatic, she has started to exercise regularly, she continues to swim and do water aerobics as well.  She has lost about 7 pounds in weight since last seen 6 months ago.  No chest pain no dyspnea.  Past Medical History:  Diagnosis Date   Anxiety    Cancer (HCC)    SKIN   Complication of anesthesia    extubation in 2015 resulted in incidental apnea; see postprocedural anesthesia note for 07-26-2013   Diuretic-induced hypokalemia    Dysrhythmia    takes combo corzide for tx    Hypertension    Pre-diabetes    Family History  Problem Relation Age of Onset   Breast cancer Mother 7   Pneumonia Father 28   Pneumonia Sister 64   Esophageal cancer Sister     Social History   Tobacco Use   Smoking status: Never   Smokeless tobacco: Never  Substance Use Topics   Alcohol use: Not Currently    Comment: social   Marital Status: Married  ROS  Review of Systems  Cardiovascular:  Negative for chest pain, dyspnea on exertion and leg swelling.   Objective  Blood pressure 131/76, pulse (!) 55, resp. rate 16, height 5\' 2"  (1.575 m), weight 175 lb 6.4 oz (79.6 kg), SpO2 96%. Body mass index is 32.08 kg/m.     12/09/2022    9:13 AM 12/06/2021    2:47 PM 08/06/2021   10:49 AM  Vitals with BMI  Height 5\' 2"  5\' 2"  5\' 2"   Weight 175 lbs 6 oz 171 lbs 3  oz 177 lbs  BMI 32.07 31.31 32.37  Systolic 131 130 409  Diastolic 76 63 83  Pulse 55 88 52    Physical Exam Neck:     Vascular: No carotid bruit or JVD.  Cardiovascular:     Rate and Rhythm: Normal rate and regular rhythm.     Pulses: Intact distal pulses.     Heart sounds: Normal heart sounds. No murmur heard.    No gallop.  Pulmonary:     Effort: Pulmonary effort is normal.     Breath sounds: Normal breath sounds.  Abdominal:     General: Bowel sounds are normal.     Palpations: Abdomen is soft.  Musculoskeletal:     Right lower leg: No edema.     Left lower leg: No edema.     Laboratory examination:   Lab Results  Component Value Date   NA 146 (H) 11/29/2021   K 3.9 11/29/2021   CO2 25 11/29/2021   GLUCOSE 108 (H) 11/29/2021   BUN 15 11/29/2021   CREATININE 0.75 11/29/2021   CALCIUM 9.5 11/29/2021   EGFR 89 11/29/2021   GFRNONAA >60 12/06/2016       Latest Ref Rng & Units 11/29/2021   11:06 AM 08/01/2021   12:15 PM 12/06/2016  3:02 PM  CMP  Glucose 70 - 99 mg/dL 161  97  95   BUN 8 - 27 mg/dL 15  18  13    Creatinine 0.57 - 1.00 mg/dL 0.96  0.45  4.09   Sodium 134 - 144 mmol/L 146  143  142   Potassium 3.5 - 5.2 mmol/L 3.9  3.8  3.5   Chloride 96 - 106 mmol/L 101  102  104   CO2 20 - 29 mmol/L 25  27  29    Calcium 8.7 - 10.3 mg/dL 9.5  81.1  9.6   Total Protein 6.5 - 8.1 g/dL   7.0   Total Bilirubin 0.3 - 1.2 mg/dL   2.1   Alkaline Phos 38 - 126 U/L   64   AST 15 - 41 U/L   23   ALT 14 - 54 U/L   13    Lab Results  Component Value Date   CHOL 148 11/29/2021   HDL 37 (L) 11/29/2021   LDLCALC 82 11/29/2021   TRIG 166 (H) 11/29/2021    External labs:   Labs 06/13/2022:  Total cholesterol 152, triglycerides 135, HDL 38, LDL 101.  Non-HDL cholesterol 114.  Sodium 144, potassium 3.6, BUN 18, 0.77, EGFR 86 mm, LFTs normal.  Bilirubin elevated at 1.9 chronically.  Magnesium 1.6.  Hb 14.2/HCT 40.0, platelets 135.  Labs 06/06/2021:  Sodium 142,  potassium 3.6, BUN 11, creatinine 0.74, EGFR 90,, LFTs normal.  Total cholesterol 171, triglycerides 114, HDL 42, LDL 130.  Non-HDL cholesterol 129.  Hepatitis C antibody nonreactive.  Magnesium 1.8.  TSH normal at 2.54.  Hb 14.2/HCT 41.0, platelets 135.  Antiphospholipid antibody elevated at 82 (<15).  Factor V Leyden mutation positive, heterozygous.  Radiology:   Chest x-ray PA and lateral view 06/27/2021: A surgical clip is projected over the superior mediastinum consistent with reported history. Heart size is borderline to mildly enlarged. The hila are normal. No pneumothorax. No nodules or masses. No focal infiltrates. No overt edema.  CT angiogram thoracic aorta 11/20/2022: Postsurgical changes after aortic coarctation repair with gradual tapering of distal aortic arch which is otherwise patent without complicating features. Minimal fusiform ectasia of the descending thoracic aorta measuring up to 3.5 cm.   NON-VASCULAR:   1. No acute intrathoracic abnormality. 2. There is an approximately 1.4 cm hypoattenuating thyroid nodule, with a nodule biopsied in 2006. The correlation prior biopsy results consider a thyroid ultrasound as clinically indicated.  Cardiac Studies:   PCV ECHOCARDIOGRAM COMPLETE 07/05/2021  Normal LV systolic function with visual EF 55-60%. Left ventricle cavity is normal in size. Moderate concentric hypertrophy of the left ventricle. Normal global wall motion. Normal diastolic filling pattern. Left atrial cavity is moderately dilated by volume. Structurally normal mitral valve.  Mild (Grade I) mitral regurgitation. Structurally normal tricuspid valve. No evidence of tricuspid stenosis. Moderate tricuspid regurgitation. Moderate pulmonary hypertension. RVSP measures 58 mmHg. Pericardium is normal. Insignificant pericardial effusion. The aortic root is normal. There is coarctation of the aorta distal to the left subclavian artery. Arch measures 3.20 cm and  coarctation measures 1.3 cm.  Peak PG of 11 mm Hg across the coarctation repair. H/O coarctation of aorta repair in 1971.  ABI 07/05/2021: This exam reveals normal perfusion of the right lower extremity (ABI 1.27) and normal perfusion of the left lower extremity (ABI 1.21). There is normal triphasic waveform pattern at the level of the ankles.     EKG:   EKG 12/09/2022: Normal sinus rhythm at  the rate of 52 bpm, left anterior block.  IVCD, LVH with repolarization abnormality.  Compared to 12/14/2020, no significant change.    Medications and allergies  No Known Allergies  Current Outpatient Medications:    allopurinol (ZYLOPRIM) 100 MG tablet, Take 100 mg by mouth 2 (two) times daily., Disp: , Rfl:    Biotin w/ Vitamins C & E (HAIR SKIN & NAILS GUMMIES PO), Take 2 each by mouth daily., Disp: , Rfl:    busPIRone (BUSPAR) 5 MG tablet, Take 5 mg by mouth daily., Disp: , Rfl:    carvedilol (COREG) 3.125 MG tablet, TAKE 1 TABLET BY MOUTH 2 TIMES DAILY., Disp: 180 tablet, Rfl: 3   ELIQUIS 5 MG TABS tablet, Take 5 mg by mouth 2 (two) times daily., Disp: , Rfl:    losartan-hydrochlorothiazide (HYZAAR) 100-25 MG tablet, TAKE 1 TABLET BY MOUTH EVERY DAY, Disp: 90 tablet, Rfl: 4   Magnesium 200 MG CHEW, Chew 200 mg by mouth daily., Disp: , Rfl:    Multiple Vitamin (MULTIVITAMIN) capsule, Take 1 capsule by mouth daily., Disp: , Rfl:    timolol (TIMOPTIC) 0.5 % ophthalmic solution, Place 1 drop into both eyes daily., Disp: , Rfl:    albuterol (VENTOLIN HFA) 108 (90 Base) MCG/ACT inhaler, Inhale into the lungs., Disp: , Rfl:    Assessment     ICD-10-CM   1. Primary hypertension  I10 EKG 12-Lead    2. History of aortic coarctation repair  Z87.74     3. Primary hypercoagulable state (HCC) antiphospholipid antibody and factor V Leiden mutation positive  D68.59        No orders of the defined types were placed in this encounter.  Orders Placed This Encounter  Procedures   EKG 12-Lead    Recommendations:   Laurie Mcneil is a 67 y.o.  Caucasian female patient with history of coarctation of aorta repair in 19 at age 71 years, primary hypertension, primary hypercoagulable state due to antiphospholipid antibody positive and factor V Leiden mutation with spontaneous DVT and on chronic anticoagulation and followed by hematology along with mild chronic thrombocytopenia.  1. Primary hypertension Patient is presently doing well, blood pressure is very well-controlled, she is on a low-dose of a beta-blocker in view of coarctation of aorta repair and has bradycardia but asymptomatic.  She is also on ARB with losartan HCT.  I reviewed her external labs, renal function is normal. Also with weight loss and exercise, her LDL has reduced from 130 last year to the present 101.  I have congratulated her. - EKG 12-Lead  2. History of aortic coarctation repair I reviewed the CT scan of the chest that was performed recently to exclude any aneurysm since coarctation aorta repair, essentially mild ectasia noted, probably will consider repeating CT scan in about 3 to 5 years.  Otherwise she is stable from cardiac standpoint.  3. Primary hypercoagulable state (HCC) antiphospholipid antibody and factor V Leiden mutation positive With regard to hypercoagulable state she is tolerating Eliquis, continue the same.  I will see her back in a year.  Other orders - Magnesium 200 MG CHEW; Chew 200 mg by mouth daily. - busPIRone (BUSPAR) 5 MG tablet; Take 5 mg by mouth daily.    Yates Decamp, MD, [facility] 12/09/2022, 9:34 AM Office: [phone]

## 2023-01-24 ENCOUNTER — Other Ambulatory Visit: Payer: Self-pay | Admitting: Medical Genetics

## 2023-01-24 DIAGNOSIS — Z006 Encounter for examination for normal comparison and control in clinical research program: Secondary | ICD-10-CM

## 2023-03-15 ENCOUNTER — Other Ambulatory Visit: Payer: Medicare PPO | Attending: Medical Genetics

## 2023-08-26 IMAGING — CR DG CHEST 2V
2 series · 2 of 2 positions shown · non-contrast
Comparison: None.

CLINICAL DATA: History of coarctation of aorta repair.

EXAM:
CHEST - 2 VIEW

[w chest pa]
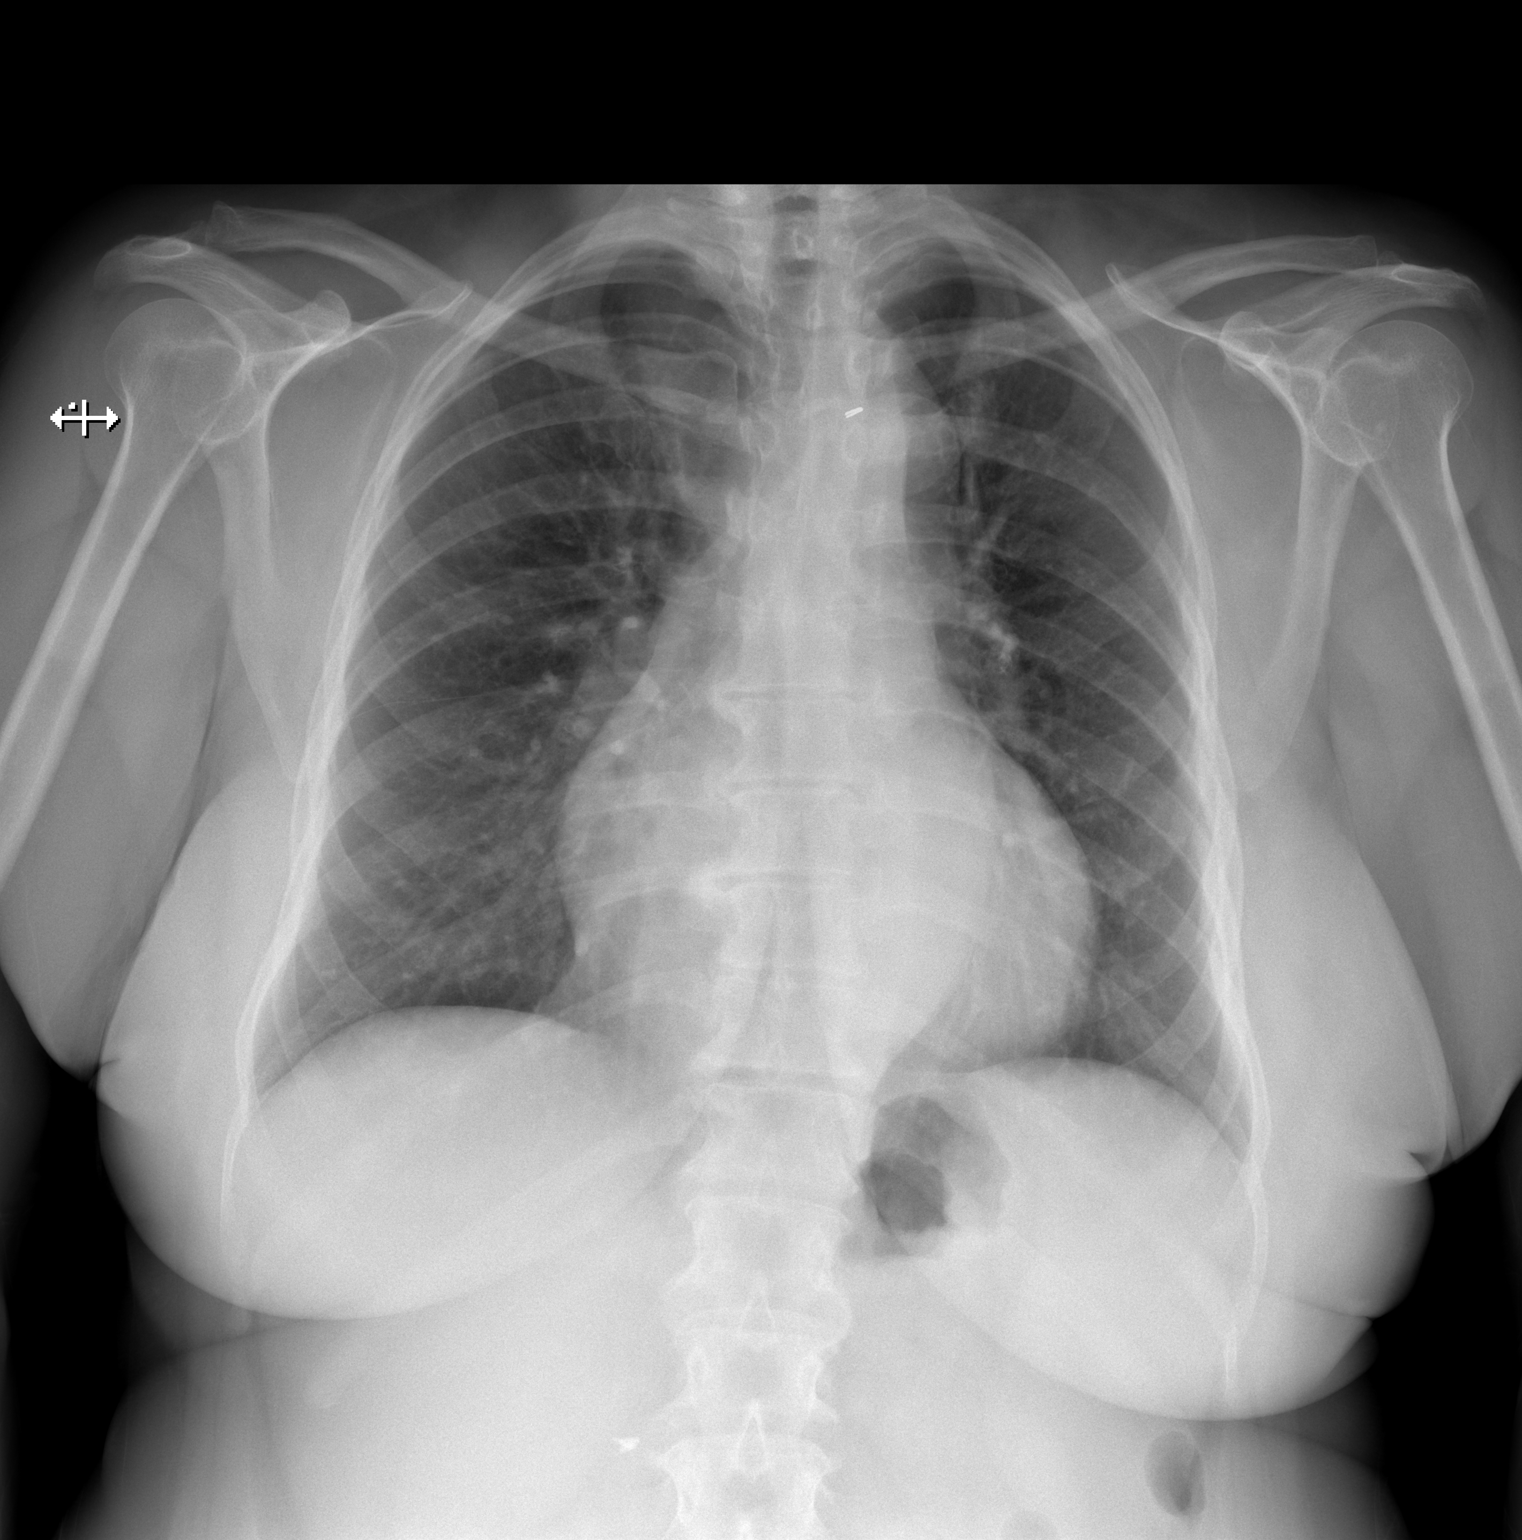

[w chest lat]
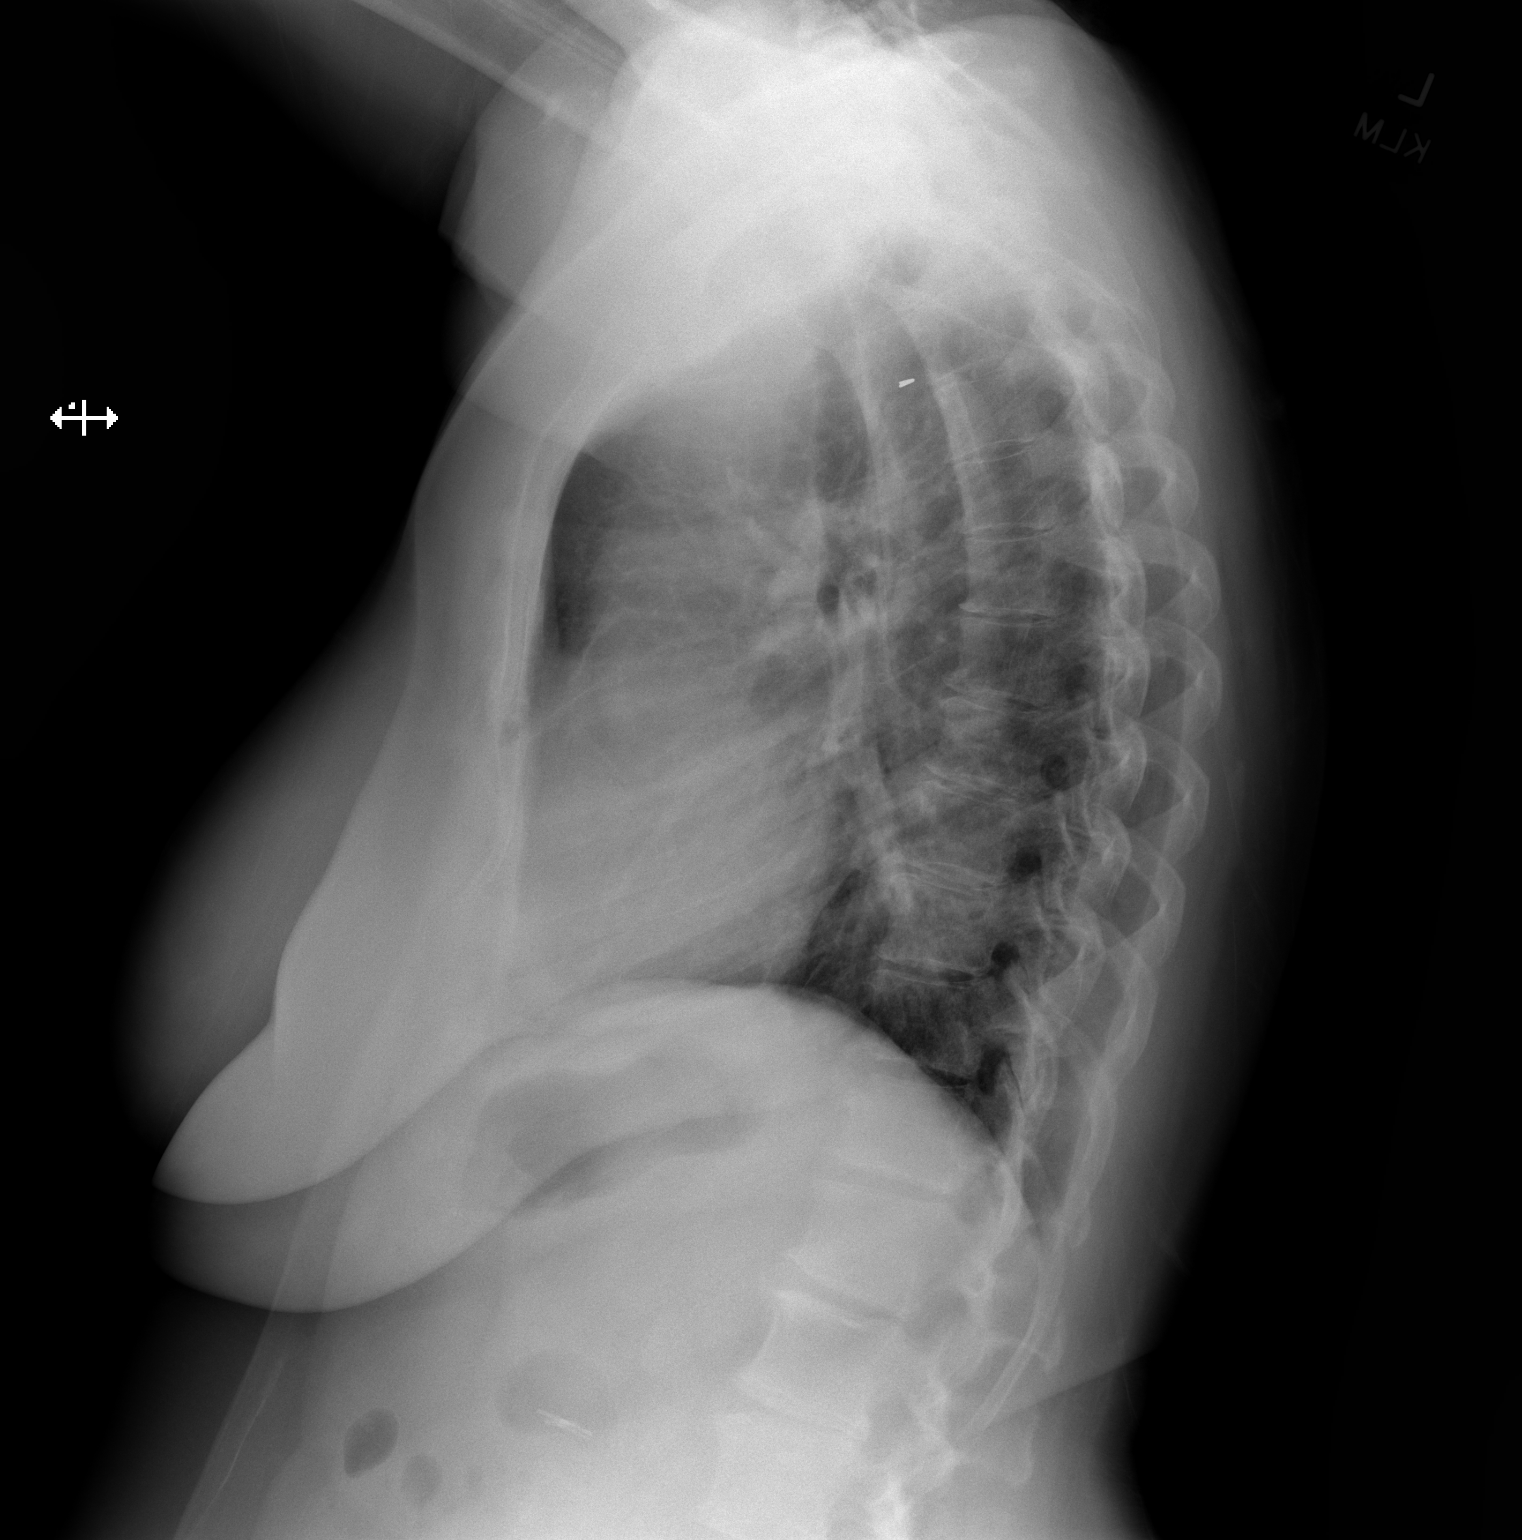

[2 of 2 positions shown; findings below may reference images not displayed]

FINDINGS: A surgical clip is projected over the superior mediastinum
consistent with reported history. Heart size is borderline to mildly
enlarged. The hila are normal. No pneumothorax. No nodules or
masses. No focal infiltrates. No overt edema.
IMPRESSION: The heart is borderline to mildly enlarged. No other acute
abnormalities are identified.

## 2023-10-20 ENCOUNTER — Other Ambulatory Visit: Payer: Self-pay

## 2023-10-20 DIAGNOSIS — I10 Essential (primary) hypertension: Secondary | ICD-10-CM

## 2023-10-20 DIAGNOSIS — Z8774 Personal history of (corrected) congenital malformations of heart and circulatory system: Secondary | ICD-10-CM

## 2023-10-20 MED ORDER — LOSARTAN POTASSIUM-HCTZ 100-25 MG PO TABS: 1.0000 | ORAL_TABLET | Freq: Every day | ORAL | 0 refills | Status: DC

## 2023-12-09 ENCOUNTER — Ambulatory Visit: Payer: Medicare PPO | Admitting: Cardiology

## 2024-03-17 ENCOUNTER — Other Ambulatory Visit: Payer: Self-pay | Admitting: Medical Genetics

## 2024-03-17 DIAGNOSIS — Z006 Encounter for examination for normal comparison and control in clinical research program: Secondary | ICD-10-CM

## 2024-04-06 ENCOUNTER — Other Ambulatory Visit: Payer: Self-pay | Admitting: Cardiology

## 2024-04-06 DIAGNOSIS — I10 Essential (primary) hypertension: Secondary | ICD-10-CM

## 2024-04-06 DIAGNOSIS — Z8774 Personal history of (corrected) congenital malformations of heart and circulatory system: Secondary | ICD-10-CM

## 2024-05-02 ENCOUNTER — Other Ambulatory Visit: Payer: Self-pay | Admitting: Cardiology

## 2024-05-02 DIAGNOSIS — I10 Essential (primary) hypertension: Secondary | ICD-10-CM

## 2024-05-02 DIAGNOSIS — Z8774 Personal history of (corrected) congenital malformations of heart and circulatory system: Secondary | ICD-10-CM

## 2024-05-13 ENCOUNTER — Other Ambulatory Visit: Payer: Self-pay | Admitting: Cardiology

## 2024-05-13 DIAGNOSIS — I10 Essential (primary) hypertension: Secondary | ICD-10-CM

## 2024-05-13 DIAGNOSIS — Z8774 Personal history of (corrected) congenital malformations of heart and circulatory system: Secondary | ICD-10-CM

## 2024-05-25 LAB — GENECONNECT MOLECULAR SCREEN: Genetic Analysis Overall Interpretation: NEGATIVE

## 2024-06-08 NOTE — Progress Notes (Unsigned)
 " Cardiology Office Note:  .   Date:  06/09/2024  ID:  Laurie Mcneil, DOB 1955-09-02, MRN [id number] PCP: Leila Lucie LABOR, MD  [facility] Providers Cardiologist:  None   History of Present Illness: .   Laurie Mcneil is a 69 y.o. Caucasian female patient with history of coarctation of aorta repair in 90 at age 52 years, primary hypertension, primary hypercoagulable state due to antiphospholipid antibody positive and factor V Leiden mutation with spontaneous DVT and on chronic anticoagulation and followed by hematology along with mild chronic thrombocytopenia.  She remains asymptomatic.    Discussed the use of AI scribe software for clinical note transcription with the patient, who gave verbal consent to proceed.  History of Present Illness Laurie Mcneil is a 69 year old female with aortic coarctation and antiphospholipid syndrome who presents for cardiovascular follow-up.  She recently had a CT scan for low magnesium that showed no acute findings. Incidental right renal cyst and mild splenomegaly were noted, with follow-up arranged, but there were no new cardiovascular issues.  She had a blood clot in her leg in June 2021 and sees hematology. She takes Eliquis 5 mg twice daily for antiphospholipid syndrome and has had no recurrent thrombosis.  She takes carvedilol  3.125 mg twice daily and losartan  hydrochlorothiazide 100/12.5 mg once daily for blood pressure. Her home blood pressures are sometimes low, but she has no dizziness or syncope.  She is very active. She swims three mornings a week, walks her dog daily, and attends an aerobic class three times a week. She is concerned about lack of weight loss despite regular exercise and avoiding sweets and alcohol.  She had aortic coarctation repaired at age 26. She later carried a pregnancy to term at age 62 without cardiovascular complications. Her adult daughter is considering moving back home.  Cardiac Studies  relevent.    Cardiac Studies & Procedures   ______________________________________________________________________________________________  CTA Chest/Abd/Pelvis 06/01/2024: 1.  No acute findings within the chest, abdomen or pelvis.  2.  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  Mild splenomegaly.  SABRA Heart: Heart size within normal limits. Trace pericardial effusion.  . Vessels: Aorta normal in caliber. No central pulmonary embolism.  . Lungs/Pleura: No large airspace consolidation or pleural effusion. Calcific granuloma right lower lobe  . Kidneys: No hydronephrosis. 1.5 cm simple cyst within lower pole of right kidney.   CT angiogram thoracic aorta 11/20/2022: Postsurgical changes after aortic coarctation repair with gradual tapering of distal aortic arch which is otherwise patent without complicating features. Minimal fusiform ectasia of the descending thoracic aorta measuring up to 3.5 cm.  Echocardiogram 07/05/2021: Normal LV systolic function with visual EF 55-60%. Left ventricle cavity is normal in size. Moderate concentric hypertrophy of the left ventricle. Normal global wall motion. Normal diastolic filling pattern. Left atrial cavity is moderately dilated by volume. Structurally normal mitral valve.  Mild (Grade I) mitral regurgitation. Structurally normal tricuspid valve. No evidence of tricuspid stenosis. Moderate tricuspid regurgitation. Moderate pulmonary hypertension. RVSP measures 58 mmHg. Pericardium is normal. Insignificant pericardial effusion. The aortic root is normal. There is coarctation of the aorta distal to the left subclavian artery. Arch measures 3.20 cm and coarctation measures 1.3 cm.  Peak PG of 11 mm Hg across the coarctation repair. H/O coarctation of aorta repair in 1971.   ABI 07/05/2021: This exam reveals normal perfusion of the right lower extremity (  ABI 1.27) and  normal perfusion of the left lower extremity (ABI 1.21). There is normal triphasic waveform pattern at the level of the ankles.  _____________________________________________________________________________________________    EKG:   EKG Interpretation Date/Time:  Wednesday June 09 2024 09:17:33 EST Ventricular Rate:  56 PR Interval:  134 QRS Duration:  120 QT Interval:  422 QTC Calculation: 407 R Axis:   -49  Text Interpretation: EKG 06/09/2024: Normal sinus rhythm at rate of 56 bpm, left anterior fascicular block.  Poor R wave progression probably related to LAFB.  LVH with repolarization abnormality.  IVCD, incomplete left bundle branch block.  Compared to 12/06/2016, no significant change. Confirmed by Dereon Corkery, Jagadeesh [phone]) on 06/09/2024 9:23:07 AM  Labs   Care everywhere/Faxed External Labs:  Labs 05/04/2024:  Sodium 141, potassium 3.6, BUN 14, creatinine 0.74, eGFR 8100 mL, LFTs normal  Hb 14.4/HCT 39.2, platelets 132.  Labs 06/19/2023:  Total cholesterol 157, triglycerides 133, HDL 40, LDL 94.  ROS  Review of Systems  Cardiovascular:  Negative for chest pain, dyspnea on exertion and leg swelling.   Physical Exam:   VS:  BP 124/74 (BP Location: Left Arm, Patient Position: Sitting)   Pulse (!) 56   Ht 5' 2 (1.575 m)   Wt 177 lb 6.4 oz (80.5 kg)   SpO2 98%   BMI 32.45 kg/m    Wt Readings from Last 3 Encounters:  06/09/24 177 lb 6.4 oz (80.5 kg)  12/09/22 175 lb 6.4 oz (79.6 kg)  12/06/21 171 lb 3.2 oz (77.7 kg)    BP Readings from Last 3 Encounters:  06/09/24 124/74  12/09/22 131/76  12/06/21 130/63   Physical Exam Neck:     Vascular: No carotid bruit or JVD.  Cardiovascular:     Rate and Rhythm: Normal rate and regular rhythm.     Pulses: Intact distal pulses.     Heart sounds: Normal heart sounds. No murmur heard.    No gallop.  Pulmonary:     Effort: Pulmonary effort is normal.     Breath sounds: Normal breath sounds.  Abdominal:     General:  Bowel sounds are normal.     Palpations: Abdomen is soft.  Musculoskeletal:     Right lower leg: No edema.     Left lower leg: No edema.    ASSESSMENT AND PLAN: .      ICD-10-CM   1. Primary hypercoagulable state (HCC) antiphospholipid antibody and factor V Leiden mutation positive  D68.59 EKG 12-Lead    2. History of aortic coarctation repair  Z87.74 EKG 12-Lead    3. Primary hypertension  I10 EKG 12-Lead     Assessment & Plan Primary hypercoagulable state (antiphospholipid syndrome) Primary hypercoagulable state due to antiphospholipid syndrome, managed with Eliquis 5 mg twice daily for thromboprophylaxis. No current thrombotic events. Discussed potential dose reduction to 2.5 mg twice daily for prophylaxis, pending hematologist's recommendation. - Continue Eliquis 5 mg twice daily. - Discuss potential dose reduction to 2.5 mg twice daily with hematologist for DVT prophylaxis dose.  History of aortic coarctation repair Aortic coarctation repair at age 86, with no changes in the aorta over 55 years. Blood pressure is well controlled with carvedilol  and losartan -hydrochlorothiazide. No need for cardiology follow-up due to stability. - Continue carvedilol  3.125 mg twice daily. - Continue losartan -hydrochlorothiazide 100/12.5 mg once daily.  Primary hypertension Well controlled with current medication regimen. Blood pressure readings are stable, with occasional low readings but no symptoms of dizziness or syncope. Emphasis on maintaining  low blood pressure due to coarctation repair. - Continue current antihypertensive regimen.  Follow up: PRN  Signed,  Gordy Bergamo, MD, [facility] 06/09/2024, 9:23 AM [facility] [address] [place], [place] [address] Phone: [phone]. Fax:  [phone]  "

## 2024-06-09 ENCOUNTER — Ambulatory Visit: Attending: Cardiology | Admitting: Cardiology

## 2024-06-09 ENCOUNTER — Encounter: Payer: Self-pay | Admitting: Cardiology

## 2024-06-09 VITALS — BP 124/74 | HR 56 | Ht 62.0 in | Wt 177.4 lb

## 2024-06-09 DIAGNOSIS — D6859 Other primary thrombophilia: Secondary | ICD-10-CM | POA: Diagnosis not present

## 2024-06-09 DIAGNOSIS — Z8774 Personal history of (corrected) congenital malformations of heart and circulatory system: Secondary | ICD-10-CM | POA: Diagnosis not present

## 2024-06-09 DIAGNOSIS — I10 Essential (primary) hypertension: Secondary | ICD-10-CM

## 2024-06-09 NOTE — Patient Instructions (Signed)
 Medication Instructions:  Your physician recommends that you continue on your current medications as directed. Please refer to the Current Medication list given to you today.  *If you need a refill on your cardiac medications before your next appointment, please call your pharmacy*  Follow-Up: At [facility], you and your health needs are our priority.  As part of our continuing mission to provide you with exceptional heart care, our providers are all part of one team.  This team includes your primary Cardiologist (physician) and Advanced Practice Providers or APPs (Physician Assistants and Nurse Practitioners) who all work together to provide you with the care you need, when you need it.  Your next appointment:      Provider:   Gordy Bergamo, MD             We recommend signing up for the patient portal called MyChart.  Patients are able to view lab/test results, encounter notes, upcoming appointments, etc.  Non-urgent messages can be sent to your provider as well, go to [email or web address].
